# Patient Record
Sex: Male | Born: 1985
Health system: Southern US, Community
[De-identification: ages and names within clinical notes are randomized; demographics above are authoritative.]

## PROBLEM LIST (undated history)

## (undated) DIAGNOSIS — G4730 Sleep apnea, unspecified: Secondary | ICD-10-CM

## (undated) DIAGNOSIS — N200 Calculus of kidney: Secondary | ICD-10-CM

## (undated) DIAGNOSIS — R519 Headache, unspecified: Secondary | ICD-10-CM

## (undated) DIAGNOSIS — R51 Headache: Secondary | ICD-10-CM

---

## 2009-04-03 ENCOUNTER — Ambulatory Visit (HOSPITAL_COMMUNITY): Admission: RE | Admit: 2009-04-03 | Discharge: 2009-04-03 | Payer: Self-pay | Admitting: Family Medicine

## 2009-04-22 ENCOUNTER — Ambulatory Visit (HOSPITAL_COMMUNITY): Admission: RE | Admit: 2009-04-22 | Discharge: 2009-04-22 | Payer: Self-pay | Admitting: Family Medicine

## 2009-06-05 ENCOUNTER — Ambulatory Visit (HOSPITAL_COMMUNITY): Admission: RE | Admit: 2009-06-05 | Discharge: 2009-06-05 | Payer: Self-pay | Admitting: Family Medicine

## 2010-04-23 ENCOUNTER — Encounter
Admission: RE | Admit: 2010-04-23 | Discharge: 2010-04-25 | Payer: Self-pay | Source: Home / Self Care | Admitting: Specialist

## 2010-05-04 IMAGING — US US ABDOMEN COMPLETE
1 series · 14 of 25 positions shown · non-contrast
Comparison: None

CLINICAL DATA: Elevated LFTs

ULTRASOUND ABDOMEN:
TECHNIQUE: Sonography of upper abdominal structures was performed.

[Series 1: us abdomen complete · 0.32mm/px · 14 of 84 slices shown]
[im 1/84]
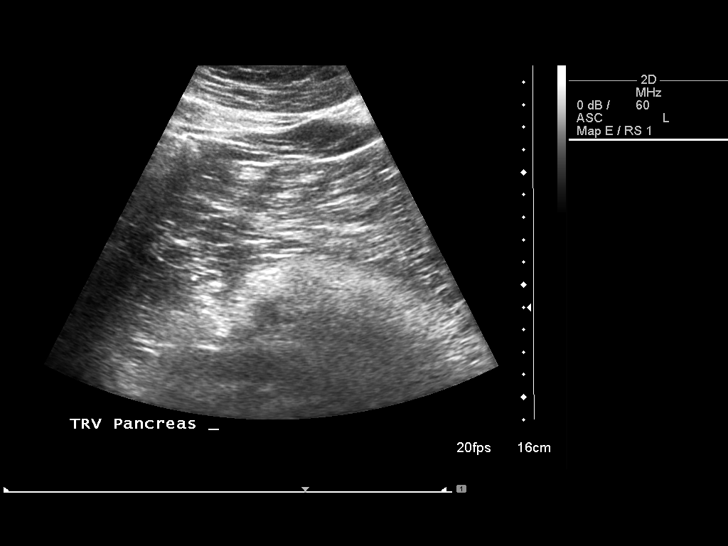
[im 7/84]
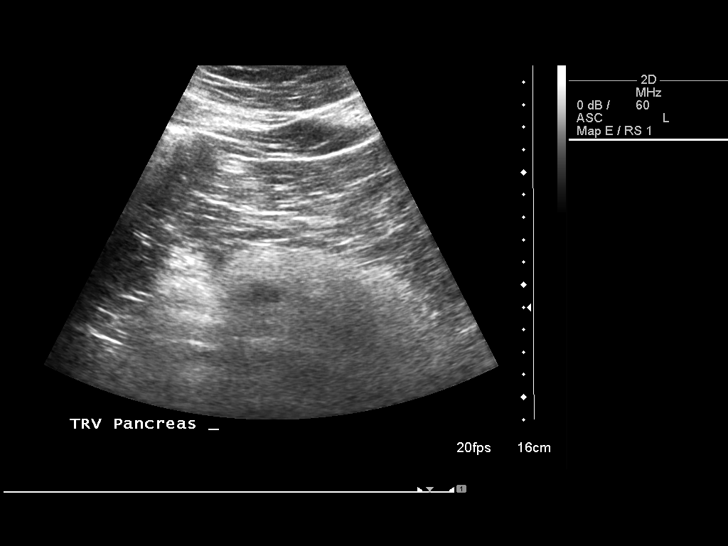
[im 14/84]
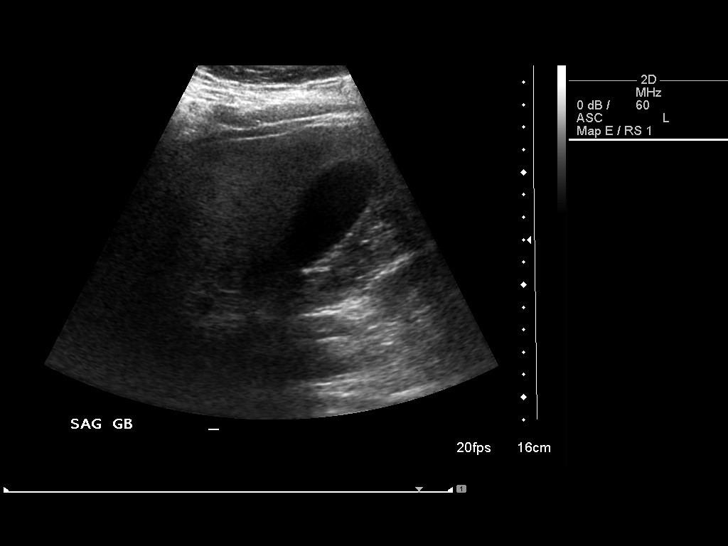
[im 21/84]
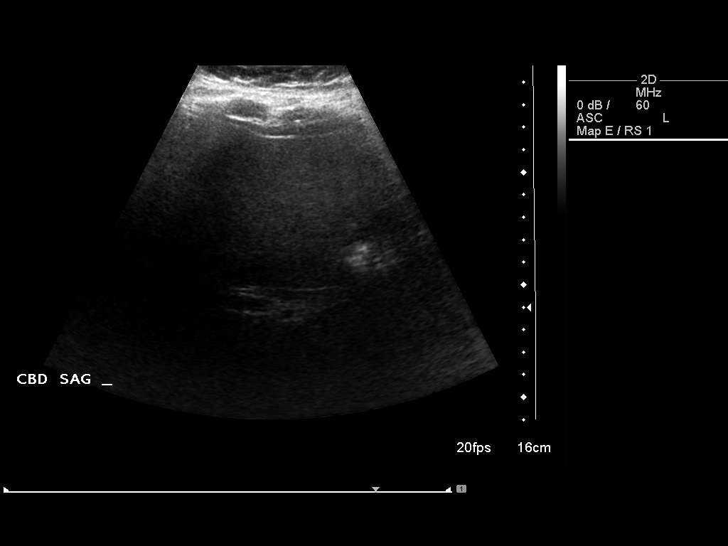
[im 28/84]
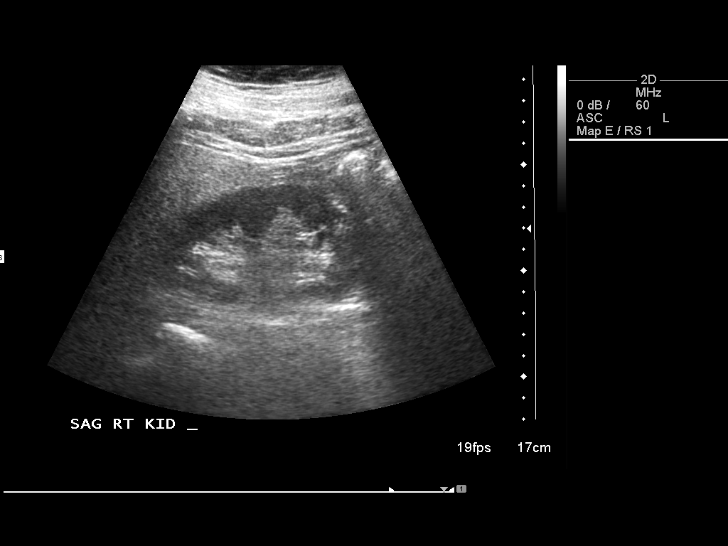
[im 32/84]
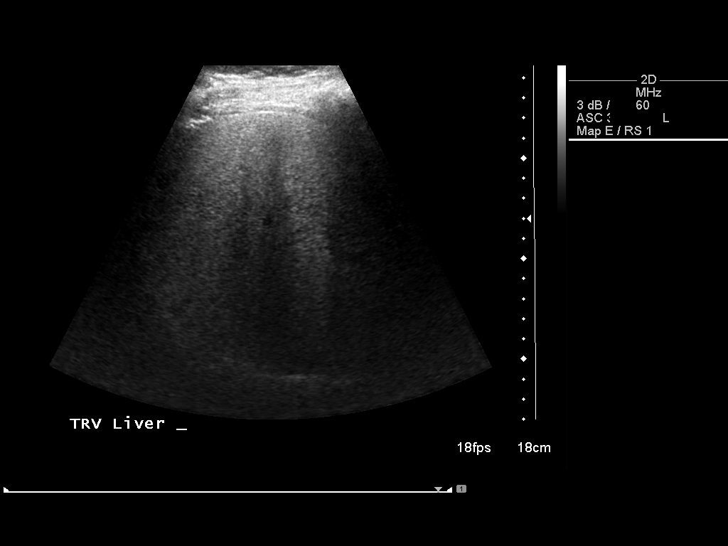
[im 39/84]
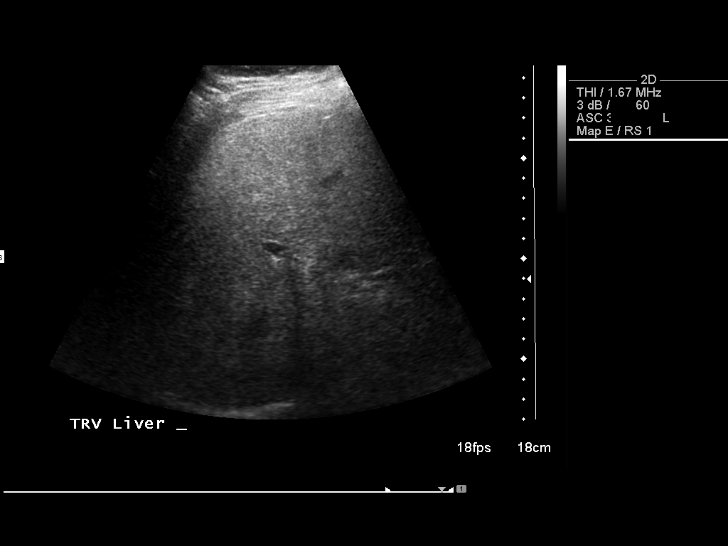
[im 45/84]
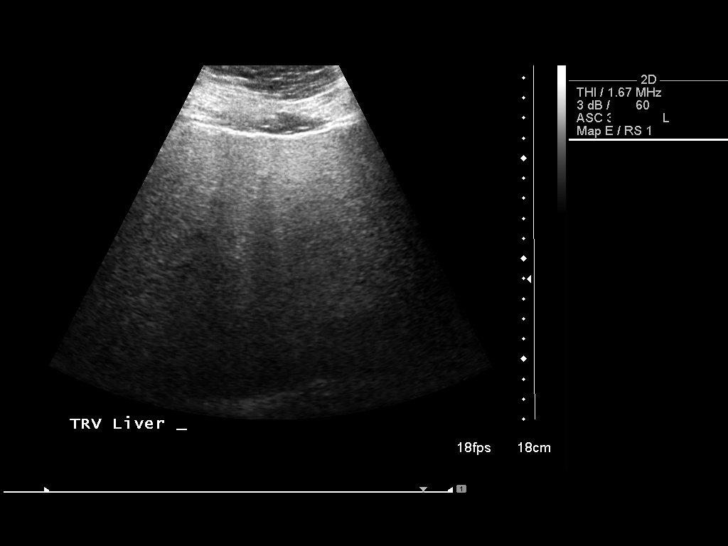
[im 52/84]
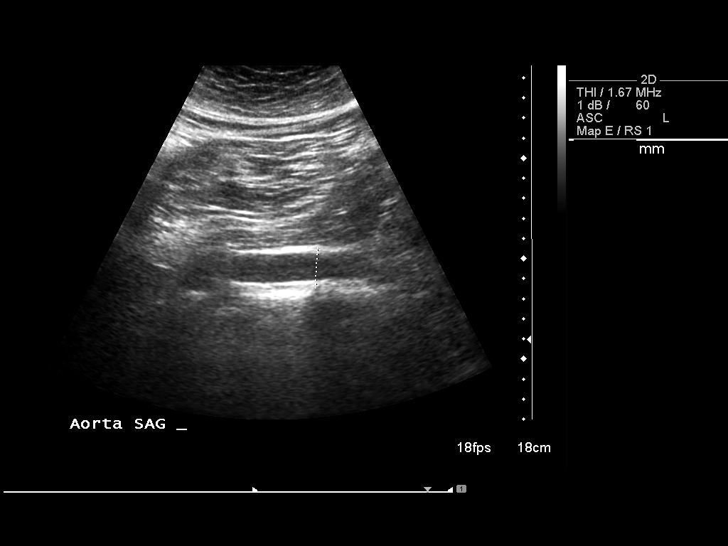
[im 56/84]
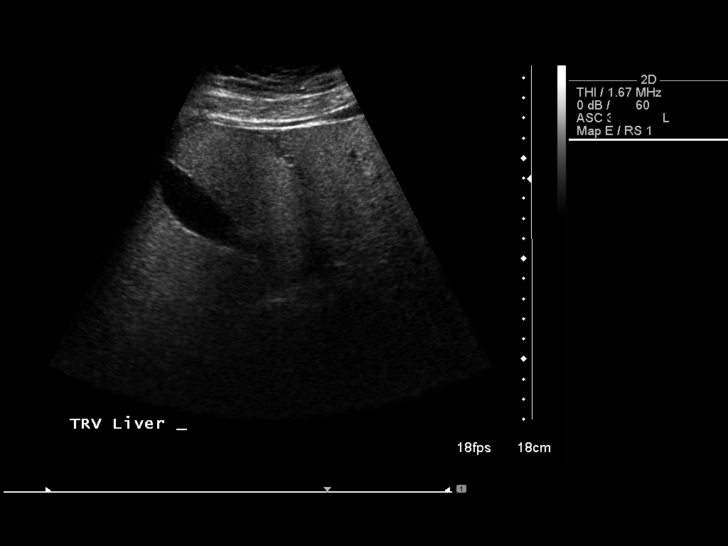
[im 63/84]
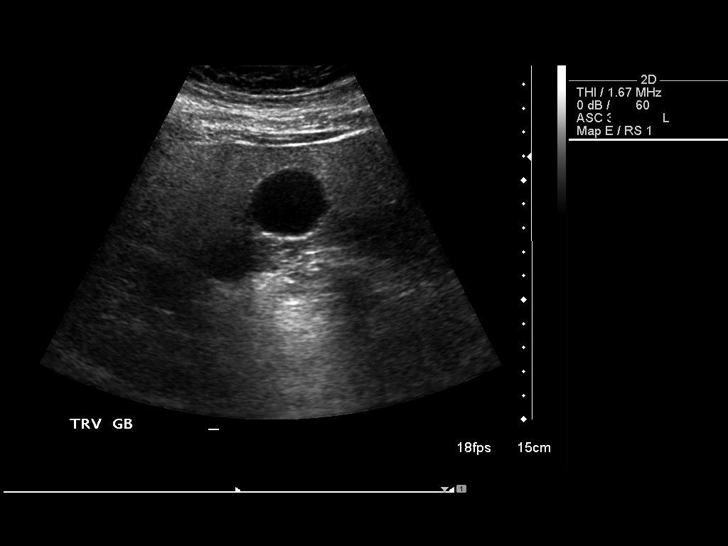
[im 70/84]
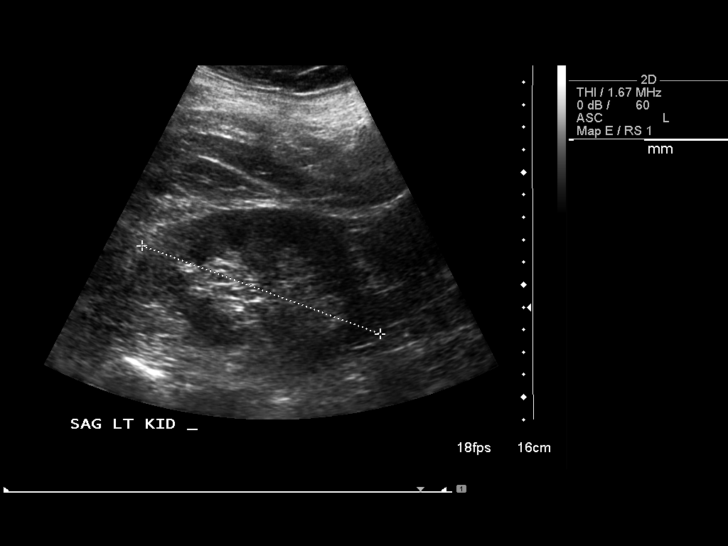
[im 77/84]
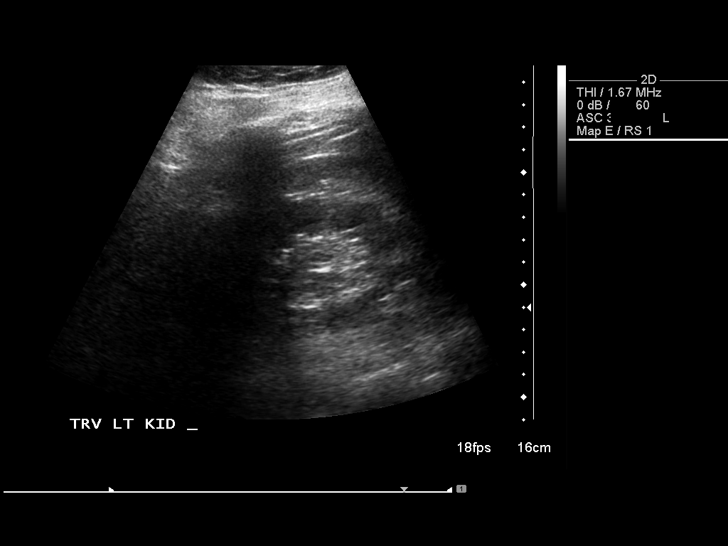
[im 84/84]
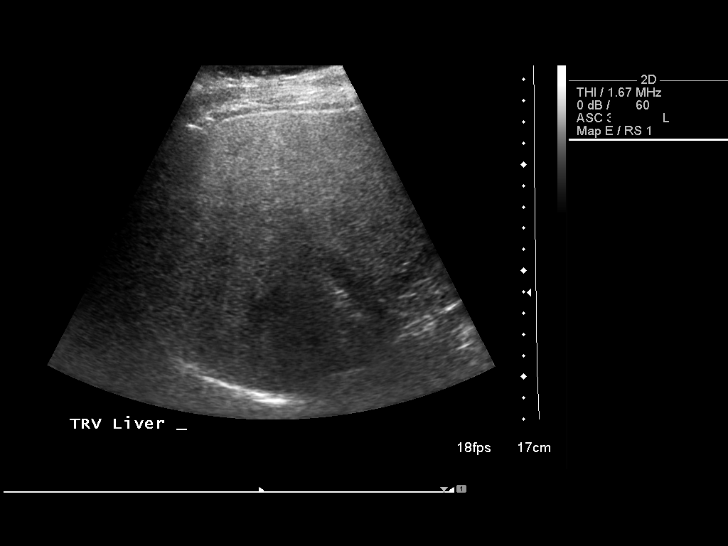

[14 of 25 positions shown; findings below may reference images not displayed]

Gallbladder:  Normally distended without stones or wall thickening.
No pericholecystic fluid or sonographic Murphy's sign.

Common bile duct:  5 mm diameter, normal

Liver:  Echogenic, likely fatty infiltration, though this can be
seen with cirrhosis and certain infiltrative disorders. No focal
hepatic mass or nodularity.

IVC:  Unremarkable

Pancreas:  Slightly heterogeneous increased pancreatic parenchymal
echogenicity suggesting partial fat replacement.  No definite
discrete mass or ductal dilatation.

Spleen:  9.4 cm length.  Normal morphology without mass.

Right kidney:  11.0 cm length. Normal morphology without mass or
hydronephrosis.

Left kidney:  11.2 cm length.  Normal morphology without mass or
hydronephrosis.

Aorta:  Unremarkable

Other:  No free fluid
IMPRESSION: Probable fatty liver and pancreas as above.
No definite acute abnormalities.

## 2010-06-23 ENCOUNTER — Ambulatory Visit (HOSPITAL_COMMUNITY): Admission: RE | Admit: 2010-06-23 | Discharge: 2010-06-23 | Payer: Self-pay | Admitting: Family Medicine

## 2010-07-09 ENCOUNTER — Emergency Department (HOSPITAL_COMMUNITY)
Admission: EM | Admit: 2010-07-09 | Discharge: 2010-07-09 | Payer: Self-pay | Source: Home / Self Care | Admitting: Emergency Medicine

## 2010-07-24 ENCOUNTER — Ambulatory Visit (HOSPITAL_COMMUNITY)
Admission: RE | Admit: 2010-07-24 | Discharge: 2010-07-24 | Payer: Self-pay | Source: Home / Self Care | Attending: Urology | Admitting: Urology

## 2010-08-26 ENCOUNTER — Other Ambulatory Visit (INDEPENDENT_AMBULATORY_CARE_PROVIDER_SITE_OTHER): Payer: Self-pay | Admitting: Internal Medicine

## 2010-08-26 ENCOUNTER — Ambulatory Visit
Admission: RE | Admit: 2010-08-26 | Discharge: 2010-08-26 | Payer: Self-pay | Source: Home / Self Care | Attending: Internal Medicine | Admitting: Internal Medicine

## 2010-08-26 DIAGNOSIS — K760 Fatty (change of) liver, not elsewhere classified: Secondary | ICD-10-CM

## 2010-08-29 ENCOUNTER — Ambulatory Visit (HOSPITAL_COMMUNITY)
Admission: RE | Admit: 2010-08-29 | Discharge: 2010-08-29 | Disposition: A | Discharge status: Home / Self Care | Payer: 59 | Source: Ambulatory Visit | Attending: Internal Medicine | Admitting: Internal Medicine

## 2010-08-29 DIAGNOSIS — K7689 Other specified diseases of liver: Secondary | ICD-10-CM | POA: Insufficient documentation

## 2010-08-29 DIAGNOSIS — K760 Fatty (change of) liver, not elsewhere classified: Secondary | ICD-10-CM

## 2010-08-29 DIAGNOSIS — K838 Other specified diseases of biliary tract: Secondary | ICD-10-CM | POA: Insufficient documentation

## 2010-10-05 NOTE — Consult Note (Signed)
NAMESKIP, LITKE           ACCOUNT NO.:  [id number]  MEDICAL RECORD NO.:  [id number]          PATIENT TYPE:  AMB  LOCATION:  [place]                           FACILITY: CLINIC  PHYSICIAN:  Dr. Karilyn Cota              DATE OF BIRTH:  March 11, 1986  DATE OF CONSULTATION:  08/26/2010                                 CONSULTATION   REASON FOR CONSULT:  Elevated transaminases and fatty liver.  HISTORY OF PRESENT ILLNESS:  Frank Gilbert is a 25 year old male referred to our office by Dr. Regino Schultze.  In November 2011, he underwent a CT of the abdomen and pelvis without contrast.  The CT revealed a fatty liver.  In December 2011, he had repeat chemistries.  It was noted that his ALT was 96 and his AST was 52.  His ALP was 81.  He states he is not having any abdominal pain at this time.  No nausea, vomiting.  No appetite changes. No constipation or diarrhea.  His appetite is good.  He has had no weight loss.  He usually have 5 bowel movements a day and they are all formed, normal size.  He denies any rectal bleeding or black stools.  He does state he has about 1-2 mixed drinks a month.  ALLERGIES:  He is allergic to PENICILLIN.  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  PAST MEDICAL HISTORY:  He has a history of migraines.  FAMILY HISTORY:  His mother is alive in good health with a history of migraines.  His father is alive with type 2 diabetes.  He has 2 sisters in good health.  He has 1 half-brother in good health.  He is married.  SOCIAL HISTORY:  He works at [employer].  He does not smoke or do drugs and he has not had any EtOH in 3 months.  He has no children.  HOME MEDICATIONS: 1. Xyzal 5 mg one a day. 2. Zonisamide 100 mg two a day. 3. Tofranil 25 mg two at night.  OBJECTIVE:  VITAL SIGNS:  His weight is 252, his height is 5 feet 9, his temp is 97.9, his blood pressure is 90/70, his pulse is 76. HEENT:  His sclerae anicteric.  His conjunctivae are pink.  He  has natural teeth.  His oral mucosa is moist.  His thyroid is normal. LUNGS:  Clear. HEART:  Regular rate and rhythm. ABDOMEN:  Soft.  Bowel sounds are positive.  No masses. EXTREMITIES:  There is no edema to his extremities.  ASSESSMENT:  Frank Gilbert is a 25 year old male with elevated transaminases.  CT scan also revealed there was diffuse fatty infiltration of the liver. while primary process may NAFLD, need to rule out other conditions.  RECOMMENDATIONS:  will get a CMET, ANA, ferritin, iron, PT/INR, sed rate, SMA, alpha-1 antitrypsin, and ceruloplasmin.  We will also draw a hepatitis screen on him.  Thank you for allowing [place] to participate in his care. Further recommendations once we have the labs back.    ______________________________ Dorene Ar, NP   ______________________________ Dr. Karilyn Cota    TS/MEDQ  D:  08/26/2010  T:  08/27/2010  Job:  [id number]  Electronically Signed by Dorene Ar PA on 08/28/2010 09:15:41 AM Electronically Signed by Lionel December M.D. on 10/05/2010 01:03:12 PM

## 2010-10-07 LAB — CBC
MCH: 31.1 pg (ref 26.0–34.0)
MCV: 82.7 fL (ref 78.0–100.0)
Platelets: 190 10*3/uL (ref 150–400)
RDW: 13 % (ref 11.5–15.5)

## 2010-10-07 LAB — BASIC METABOLIC PANEL
BUN: 15 mg/dL (ref 6–23)
Creatinine, Ser: 1.29 mg/dL (ref 0.4–1.5)
GFR calc Af Amer: >60 mL/min (ref >60)
Potassium: 4 mEq/L (ref 3.5–5.1)
Sodium: 141 mEq/L (ref 135–145)

## 2010-10-07 LAB — URINALYSIS, ROUTINE W REFLEX MICROSCOPIC
Bilirubin Urine: NEGATIVE
Glucose, UA: NEGATIVE mg/dL
Ketones, ur: NEGATIVE mg/dL
Leukocytes, UA: NEGATIVE
Protein, ur: NEGATIVE mg/dL

## 2010-10-07 LAB — DIFFERENTIAL
Eosinophils Absolute: 0 10*3/uL (ref 0.0–0.7)
Eosinophils Relative: 0 % (ref 0–5)
Lymphs Abs: 0.7 10*3/uL (ref 0.7–4.0)

## 2011-04-28 ENCOUNTER — Emergency Department (HOSPITAL_COMMUNITY): Payer: 59

## 2011-04-28 ENCOUNTER — Emergency Department (HOSPITAL_COMMUNITY)
Admission: EM | Admit: 2011-04-28 | Discharge: 2011-04-29 | Disposition: A | Discharge status: Home / Self Care | Payer: 59 | Attending: Emergency Medicine | Admitting: Emergency Medicine

## 2011-04-28 ENCOUNTER — Encounter: Payer: Self-pay | Admitting: *Deleted

## 2011-04-28 DIAGNOSIS — S6390XA Sprain of unspecified part of unspecified wrist and hand, initial encounter: Secondary | ICD-10-CM | POA: Insufficient documentation

## 2011-04-28 DIAGNOSIS — S0003XA Contusion of scalp, initial encounter: Secondary | ICD-10-CM | POA: Insufficient documentation

## 2011-04-28 DIAGNOSIS — M79609 Pain in unspecified limb: Secondary | ICD-10-CM | POA: Insufficient documentation

## 2011-04-28 DIAGNOSIS — S1093XA Contusion of unspecified part of neck, initial encounter: Secondary | ICD-10-CM | POA: Insufficient documentation

## 2011-04-28 DIAGNOSIS — S63619A Unspecified sprain of unspecified finger, initial encounter: Secondary | ICD-10-CM

## 2011-04-28 DIAGNOSIS — S0093XA Contusion of unspecified part of head, initial encounter: Secondary | ICD-10-CM

## 2011-04-28 DIAGNOSIS — R51 Headache: Secondary | ICD-10-CM | POA: Insufficient documentation

## 2011-04-28 HISTORY — DX: Calculus of kidney: N20.0

## 2011-04-28 MED ORDER — ACETAMINOPHEN 500 MG PO TABS
1000.0000 mg | ORAL_TABLET | Freq: Once | ORAL | Status: AC
  Administered 2011-04-28: 1000 mg via ORAL
  Filled 2011-04-28: qty 2
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
## 2011-04-29 NOTE — ED Notes (Signed)
Released ambulatory, no distress.

## 2012-02-24 ENCOUNTER — Other Ambulatory Visit (HOSPITAL_COMMUNITY): Payer: Self-pay | Admitting: Family Medicine

## 2012-02-24 ENCOUNTER — Ambulatory Visit (HOSPITAL_COMMUNITY)
Admission: RE | Admit: 2012-02-24 | Discharge: 2012-02-24 | Disposition: A | Discharge status: Home / Self Care | Payer: BC Managed Care – PPO | Source: Ambulatory Visit | Attending: Family Medicine | Admitting: Family Medicine

## 2012-02-24 DIAGNOSIS — A09 Infectious gastroenteritis and colitis, unspecified: Secondary | ICD-10-CM

## 2012-02-24 DIAGNOSIS — R1032 Left lower quadrant pain: Secondary | ICD-10-CM | POA: Insufficient documentation

## 2012-02-24 DIAGNOSIS — R933 Abnormal findings on diagnostic imaging of other parts of digestive tract: Secondary | ICD-10-CM | POA: Insufficient documentation

## 2014-06-26 HISTORY — PX: LITHOTRIPSY: SUR834

## 2014-07-04 ENCOUNTER — Encounter (HOSPITAL_COMMUNITY): Payer: Self-pay | Admitting: Emergency Medicine

## 2014-07-04 ENCOUNTER — Emergency Department (HOSPITAL_COMMUNITY): Payer: 59

## 2014-07-04 ENCOUNTER — Emergency Department (HOSPITAL_COMMUNITY)
Admission: EM | Admit: 2014-07-04 | Discharge: 2014-07-05 | Disposition: A | Discharge status: Home / Self Care | Payer: 59 | Attending: Emergency Medicine | Admitting: Emergency Medicine

## 2014-07-04 DIAGNOSIS — Z880 Allergy status to penicillin: Secondary | ICD-10-CM | POA: Diagnosis not present

## 2014-07-04 DIAGNOSIS — R1031 Right lower quadrant pain: Secondary | ICD-10-CM | POA: Diagnosis present

## 2014-07-04 DIAGNOSIS — J45909 Unspecified asthma, uncomplicated: Secondary | ICD-10-CM | POA: Diagnosis not present

## 2014-07-04 DIAGNOSIS — Z79899 Other long term (current) drug therapy: Secondary | ICD-10-CM | POA: Insufficient documentation

## 2014-07-04 DIAGNOSIS — R52 Pain, unspecified: Secondary | ICD-10-CM

## 2014-07-04 DIAGNOSIS — N200 Calculus of kidney: Secondary | ICD-10-CM | POA: Diagnosis not present

## 2014-07-04 LAB — CBC WITH DIFFERENTIAL/PLATELET
Basophils Absolute: 0 10*3/uL (ref 0.0–0.1)
Basophils Relative: 0 % (ref 0–1)
Eosinophils Absolute: 0.1 10*3/uL (ref 0.0–0.7)
Eosinophils Relative: 1 % (ref 0–5)
HCT: 36.3 % — ABNORMAL LOW (ref 39.0–52.0)
Hemoglobin: 12.9 g/dL — ABNORMAL LOW (ref 13.0–17.0)
LYMPHS ABS: 1.4 10*3/uL (ref 0.7–4.0)
LYMPHS PCT: 17 % (ref 12–46)
MCH: 30.3 pg (ref 26.0–34.0)
MCHC: 35.5 g/dL (ref 30.0–36.0)
MCV: 85.2 fL (ref 78.0–100.0)
Monocytes Absolute: 0.4 10*3/uL (ref 0.1–1.0)
Monocytes Relative: 6 % (ref 3–12)
NEUTROS ABS: 6 10*3/uL (ref 1.7–7.7)
NEUTROS PCT: 76 % (ref 43–77)
Platelets: 177 10*3/uL (ref 150–400)
RBC: 4.26 MIL/uL (ref 4.22–5.81)
RDW: 13.2 % (ref 11.5–15.5)
WBC: 7.9 10*3/uL (ref 4.0–10.5)

## 2014-07-04 MED ORDER — ONDANSETRON HCL 4 MG/2ML IJ SOLN
4.0000 mg | Freq: Once | INTRAMUSCULAR | Status: AC
  Administered 2014-07-04: 4 mg via INTRAVENOUS
  Filled 2014-07-04: qty 2

## 2014-07-04 MED ORDER — KETOROLAC TROMETHAMINE 30 MG/ML IJ SOLN
30.0000 mg | Freq: Once | INTRAMUSCULAR | Status: AC
  Administered 2014-07-04: 30 mg via INTRAVENOUS
  Filled 2014-07-04: qty 1

## 2014-07-04 MED ORDER — HYDROMORPHONE HCL 1 MG/ML IJ SOLN
1.0000 mg | Freq: Once | INTRAMUSCULAR | Status: AC
  Administered 2014-07-04: 1 mg via INTRAVENOUS
  Filled 2014-07-04: qty 1

## 2014-07-04 NOTE — ED Notes (Signed)
Pt c/o rt flank pain, rt abd and rt testicle pain x 2 hours.

## 2014-07-04 NOTE — ED Provider Notes (Signed)
CSN: [id number]     Arrival date & time 07/04/14  2238 History  This chart was scribed for Benny LennertJoseph L Rayn Shorb, MD by Luisa DagoPriscilla Tutu, ED Scribe. This patient was seen in room APA10/APA10 and the patient's care was started at 11:09 PM.     Chief Complaint  Patient presents with  . Flank Pain   Patient is a 28 y.o. male presenting with flank pain. The history is provided by the patient. No language interpreter was used.  Flank Pain This is a new problem. The current episode started 6 to 12 hours ago. The problem occurs constantly. The problem has been gradually worsening. Associated symptoms include abdominal pain. Pertinent negatives include no chest pain, no headaches and no shortness of breath. Nothing aggravates the symptoms. Nothing relieves the symptoms. He has tried nothing for the symptoms.   HPI Comments: Frank CoxChristopher S Gilbert is a 28 y.o. male who presents to the Emergency Department complaining of sudden onset right sided flank pain with onset today. He is also complaining of associated groin pain. Pt endorses a hx of kidney stones. He also endorses associated chills. Girlfriend states that pt also has a lump on his testicle. He endorses some associated discomfort. Denies fever, prior abdominal pain, nausea, emesis,      Past Medical History  Diagnosis Date  . Asthma   . Kidney stone    History reviewed. No pertinent past surgical history. No family history on file. History  Substance Use Topics  . Smoking status: Never Smoker   . Smokeless tobacco: Not on file  . Alcohol Use: Yes     Comment: occasionally    Review of Systems  Constitutional: Negative for appetite change and fatigue.  HENT: Negative for congestion, ear discharge and sinus pressure.   Eyes: Negative for discharge.  Respiratory: Negative for cough and shortness of breath.   Cardiovascular: Negative for chest pain.  Gastrointestinal: Positive for abdominal pain. Negative for diarrhea.  Genitourinary: Positive  for flank pain. Negative for frequency and hematuria.  Musculoskeletal: Negative for back pain.  Skin: Negative for rash.  Neurological: Negative for seizures and headaches.  Psychiatric/Behavioral: Negative for hallucinations.  All other systems reviewed and are negative.     Allergies  Oxycodone and Penicillins  Home Medications   Prior to Admission medications   Medication Sig Start Date End Date Taking? Authorizing Provider  Beclomethasone Dipropionate (QVAR IN) Inhale 1 puff into the lungs daily as needed. For shortness of breath     Historical Provider, MD  levocetirizine (XYZAL) 5 MG tablet Take 5 mg by mouth every morning.      Historical Provider, MD  methocarbamol (ROBAXIN) 750 MG tablet Take 750 mg by mouth 4 (four) times daily.      Historical Provider, MD  Tetrahydrozoline HCl (EYE DROPS OP) Apply 1 drop to eye daily.      Historical Provider, MD   Triage Vitals: BP 129/116 mmHg  Pulse 87  Temp(Src) 98 F (36.7 C) (Oral)  Resp 22  Ht 5\' 11"  (1.803 m)  Wt 260 lb (117.935 kg)  BMI 36.28 kg/m2  SpO2 98%  Physical Exam  Constitutional: He is oriented to person, place, and time. He appears well-developed and well-nourished. No distress.  HENT:  Head: Normocephalic and atraumatic.  Right Ear: Hearing normal.  Left Ear: Hearing normal.  Nose: Nose normal.  Mouth/Throat: Oropharynx is clear and moist and mucous membranes are normal.  Eyes: Conjunctivae and EOM are normal. Pupils are equal, round, and reactive  to light.  Neck: Normal range of motion. Neck supple.  Cardiovascular: Regular rhythm, S1 normal and S2 normal.  Exam reveals no gallop and no friction rub.   No murmur heard. Pulmonary/Chest: Effort normal and breath sounds normal. No respiratory distress. He exhibits no tenderness.  Abdominal: Soft. Normal appearance and bowel sounds are normal. There is no hepatosplenomegaly. There is tenderness in the right lower quadrant. There is no rebound, no guarding,  no tenderness at McBurney's point and negative Murphy's sign. No hernia.  Moderate right flank tenderness.  Musculoskeletal: Normal range of motion.  Neurological: He is alert and oriented to person, place, and time. He has normal strength. No cranial nerve deficit or sensory deficit. Coordination normal. GCS eye subscore is 4. GCS verbal subscore is 5. GCS motor subscore is 6.  Skin: Skin is warm, dry and intact. No rash noted. No cyanosis.  Psychiatric: He has a normal mood and affect. His speech is normal and behavior is normal. Thought content normal.  Nursing note and vitals reviewed.   ED Course  Procedures (including critical care time)  DIAGNOSTIC STUDIES: Oxygen Saturation is 98% on RA, normal by my interpretation.    COORDINATION OF CARE: 11:12 PM- Pt advised of plan for treatment and pt agrees.  Labs Review Labs Reviewed - No data to display  Imaging Review No results found.   EKG Interpretation None      MDM   Final diagnoses:  None    Kidney stone  I personally performed the services described in this documentation, which was scribed in my presence. The recorded information has been reviewed and is accurate.    Benny LennertJoseph L Anija Brickner, MD 07/05/14 [phone]

## 2014-07-05 LAB — COMPREHENSIVE METABOLIC PANEL
ALT: 66 U/L — AB (ref 0–53)
AST: 42 U/L — AB (ref 0–37)
Albumin: 3.9 g/dL (ref 3.5–5.2)
Alkaline Phosphatase: 80 U/L (ref 39–117)
Anion gap: 11 (ref 5–15)
BUN: 11 mg/dL (ref 6–23)
CO2: 28 meq/L (ref 19–32)
Calcium: 9.5 mg/dL (ref 8.4–10.5)
Chloride: 99 mEq/L (ref 96–112)
Creatinine, Ser: 1.15 mg/dL (ref 0.50–1.35)
GFR calc Af Amer: >90 mL/min (ref >90)
GFR, EST NON AFRICAN AMERICAN: 84 mL/min — AB (ref >90)
GLUCOSE: 108 mg/dL — AB (ref 70–99)
POTASSIUM: 3.9 meq/L (ref 3.7–5.3)
SODIUM: 138 meq/L (ref 137–147)
Total Bilirubin: 0.7 mg/dL (ref 0.3–1.2)
Total Protein: 7 g/dL (ref 6.0–8.3)

## 2014-07-05 LAB — URINALYSIS, ROUTINE W REFLEX MICROSCOPIC
BILIRUBIN URINE: NEGATIVE
Glucose, UA: NEGATIVE mg/dL
Ketones, ur: NEGATIVE mg/dL
Leukocytes, UA: NEGATIVE
Nitrite: NEGATIVE
PROTEIN: 30 mg/dL — AB
Urobilinogen, UA: 0.2 mg/dL (ref 0.0–1.0)
pH: 5.5 (ref 5.0–8.0)

## 2014-07-05 LAB — URINE MICROSCOPIC-ADD ON

## 2014-07-05 MED ORDER — TAMSULOSIN HCL 0.4 MG PO CAPS: 0.4000 mg | ORAL_CAPSULE | Freq: Every day | ORAL | Status: DC

## 2014-07-05 MED ORDER — DIPHENHYDRAMINE HCL 50 MG/ML IJ SOLN
25.0000 mg | Freq: Once | INTRAMUSCULAR | Status: AC
  Administered 2014-07-05: 25 mg via INTRAVENOUS
  Filled 2014-07-05: qty 1

## 2014-07-05 MED ORDER — HYDROCODONE-ACETAMINOPHEN 5-325 MG PO TABS: 1.0000 | ORAL_TABLET | Freq: Four times a day (QID) | ORAL | Status: DC | PRN

## 2014-07-05 MED ORDER — ONDANSETRON 4 MG PO TBDP: ORAL_TABLET | ORAL | Status: DC

## 2014-07-05 NOTE — Discharge Instructions (Signed)
Follow up with a urologist next week.  

## 2014-07-16 ENCOUNTER — Other Ambulatory Visit: Payer: Self-pay | Admitting: Urology

## 2014-07-17 ENCOUNTER — Other Ambulatory Visit: Payer: Self-pay | Admitting: Urology

## 2014-07-18 ENCOUNTER — Encounter (HOSPITAL_COMMUNITY): Payer: Self-pay | Admitting: *Deleted

## 2014-07-22 MED ORDER — GENTAMICIN SULFATE 40 MG/ML IJ SOLN
460.0000 mg | INTRAVENOUS | Status: AC
  Administered 2014-07-23: 460 mg via INTRAVENOUS
  Filled 2014-07-22: qty 11.5

## 2014-07-23 ENCOUNTER — Ambulatory Visit (HOSPITAL_COMMUNITY): Payer: 59

## 2014-07-23 ENCOUNTER — Encounter (HOSPITAL_COMMUNITY)
Admission: RE | Disposition: A | Discharge status: Home / Self Care | Payer: Self-pay | Source: Ambulatory Visit | Attending: Urology

## 2014-07-23 ENCOUNTER — Encounter (HOSPITAL_COMMUNITY): Payer: Self-pay | Admitting: General Practice

## 2014-07-23 ENCOUNTER — Ambulatory Visit (HOSPITAL_COMMUNITY)
Admission: RE | Admit: 2014-07-23 | Discharge: 2014-07-23 | Disposition: A | Discharge status: Home / Self Care | Payer: 59 | Source: Ambulatory Visit | Attending: Urology | Admitting: Urology

## 2014-07-23 DIAGNOSIS — Z885 Allergy status to narcotic agent status: Secondary | ICD-10-CM | POA: Diagnosis not present

## 2014-07-23 DIAGNOSIS — Z880 Allergy status to penicillin: Secondary | ICD-10-CM | POA: Insufficient documentation

## 2014-07-23 DIAGNOSIS — G43909 Migraine, unspecified, not intractable, without status migrainosus: Secondary | ICD-10-CM | POA: Insufficient documentation

## 2014-07-23 DIAGNOSIS — G4730 Sleep apnea, unspecified: Secondary | ICD-10-CM | POA: Diagnosis not present

## 2014-07-23 DIAGNOSIS — N201 Calculus of ureter: Secondary | ICD-10-CM | POA: Diagnosis present

## 2014-07-23 DIAGNOSIS — J45909 Unspecified asthma, uncomplicated: Secondary | ICD-10-CM | POA: Diagnosis not present

## 2014-07-23 HISTORY — DX: Headache: R51

## 2014-07-23 HISTORY — DX: Sleep apnea, unspecified: G47.30

## 2014-07-23 HISTORY — DX: Headache, unspecified: R51.9

## 2014-07-23 LAB — CREATININE, SERUM
CREATININE: 0.84 mg/dL (ref 0.50–1.35)
GFR calc Af Amer: >90 mL/min (ref >90)
GFR calc non Af Amer: >90 mL/min (ref >90)

## 2014-07-23 SURGERY — LITHOTRIPSY, ESWL
Anesthesia: LOCAL | Laterality: Right

## 2014-07-23 MED ORDER — SENNOSIDES-DOCUSATE SODIUM 8.6-50 MG PO TABS: 1.0000 | ORAL_TABLET | Freq: Two times a day (BID) | ORAL | Status: DC

## 2014-07-23 MED ORDER — DIPHENHYDRAMINE HCL 25 MG PO CAPS
25.0000 mg | ORAL_CAPSULE | ORAL | Status: AC
  Administered 2014-07-23: 25 mg via ORAL
  Filled 2014-07-23: qty 1

## 2014-07-23 MED ORDER — ONDANSETRON 4 MG PO TBDP: ORAL_TABLET | ORAL | Status: DC

## 2014-07-23 MED ORDER — SODIUM CHLORIDE 0.9 % IV SOLN
INTRAVENOUS | Status: DC
  Administered 2014-07-23: 07:00:00 via INTRAVENOUS

## 2014-07-23 MED ORDER — HYDROMORPHONE HCL 2 MG PO TABS: 2.0000 mg | ORAL_TABLET | ORAL | Status: DC | PRN

## 2014-07-23 MED ORDER — DIAZEPAM 5 MG PO TABS
10.0000 mg | ORAL_TABLET | ORAL | Status: AC
  Administered 2014-07-23: 10 mg via ORAL
  Filled 2014-07-23: qty 2

## 2014-07-23 MED ORDER — PROMETHAZINE HCL 25 MG/ML IJ SOLN
25.0000 mg | INTRAMUSCULAR | Status: DC | PRN
  Administered 2014-07-23: 25 mg via INTRAVENOUS
  Filled 2014-07-23: qty 1

## 2014-07-23 NOTE — Brief Op Note (Signed)
07/23/2014  8:34 AM  PATIENT:  Frank Gilbert  28 y.o. male  PRE-OPERATIVE DIAGNOSIS:  RIGHT URETERAL STONE  POST-OPERATIVE DIAGNOSIS:  * No post-op diagnosis entered *  PROCEDURE:  Procedure(s): RIGHT EXTRACORPOREAL SHOCK WAVE LITHOTRIPSY (ESWL) (Right)  SURGEON:  Surgeon(s) and Role:    * Sebastian Acheheodore Torri Michalski, MD - Primary  PHYSICIAN ASSISTANT:   ASSISTANTS: none   ANESTHESIA:   none  EBL:     BLOOD ADMINISTERED:none  DRAINS: none   LOCAL MEDICATIONS USED:  NONE  SPECIMEN:  No Specimen  DISPOSITION OF SPECIMEN:  N/A  COUNTS:  YES  TOURNIQUET:  * No tourniquets in log *  DICTATION: .Note written in paper chart  PLAN OF CARE: Discharge to home after PACU  PATIENT DISPOSITION:  PACU - hemodynamically stable.   Delay start of Pharmacological VTE agent (>24hrs) due to surgical blood loss or risk of bleeding: yes

## 2014-07-23 NOTE — H&P (Signed)
Frank Gilbert is an 28 y.o. male.    Chief Complaint: Pre-Op Right Shockwave Lithotripsy  HPI:   1 - Rt Ureteral Stone - Rt proximal ureteral stone 5.285mm by ER CT 06/2014 on eval colickly flank pain. Stone is 15cm SSD, 700HU and just lateral to L3 transverse process.   Given trial of medical therapy but no interval passage and persistence on KUB. NO interval fevers.  Today Frank Gilbert is seen to proceed with Rt shockwave lithotripsy.   Past Medical History  Diagnosis Date  . Asthma   . Sleep apnea     no longer uses CPAP  . Headache     migraines  . Kidney stone     right ureteral stone    History reviewed. No pertinent past surgical history.  History reviewed. No pertinent family history. Social History:  reports that he has never smoked. He does not have any smokeless tobacco history on file. He reports that he drinks alcohol. He reports that he does not use illicit drugs.  Allergies:  Allergies  Allergen Reactions  . Oxycodone Itching and Rash  . Penicillins Rash    No prescriptions prior to admission    No results found for this or any previous visit (from the past 48 hour(s)). No results found.  Review of Systems  Constitutional: Negative.  Negative for fever and chills.  HENT: Negative.   Eyes: Negative.   Respiratory: Negative.   Cardiovascular: Negative.   Gastrointestinal: Positive for nausea.  Genitourinary: Positive for flank pain.  Musculoskeletal: Negative.   Skin: Negative.   Neurological: Negative.   Endo/Heme/Allergies: Negative.   Psychiatric/Behavioral: Negative.     There were no vitals taken for this visit. Physical Exam  Constitutional: He appears well-developed.  HENT:  Head: Normocephalic.  Neck: Normal range of motion.  Cardiovascular: Normal rate.   Respiratory: Effort normal.  GI: Soft.  Genitourinary:  Mild Rt CVAT  Musculoskeletal: Normal range of motion.  Neurological: He is alert.  Skin: Skin is warm.   Psychiatric: He has a normal mood and affect. His behavior is normal. Judgment and thought content normal.     Assessment/Plan  1 - Rt Ureteral Stone - We discussed shockwave lithotripsy in detail as well as my "rule of 9s" with stones <739mm, less than 900 HU, and skin to stone distance <9cm having approximately 90% treatment success with single session of treatment. We then addressed how stones that are larger, more dense, and in patients with less favorable anatomy have incrementally decreased success rates. We discussed risks including, bleeding, infection, hematoma, loss of kidney, need for staged therapy, need for adjunctive therapy and requirement to refrain from any anticoagulants, anti-platelet or aspirin-like products peri-procedureally. After careful consideration, the patient has chosen to proceed.   Frank Gilbert 07/23/2014, 5:47 AM

## 2014-07-23 NOTE — Discharge Instructions (Signed)
1 - You may have urinary urgency (bladder spasms) and bloody urine on / off and pass small stone fragments for several days. This is normal.  2 - Call MD or go to ER for fever >102, severe pain / nausea / vomiting not relieved by medications, or acute change in medical status

## 2015-04-04 ENCOUNTER — Ambulatory Visit (INDEPENDENT_AMBULATORY_CARE_PROVIDER_SITE_OTHER): Payer: 59 | Admitting: Internal Medicine

## 2015-04-04 ENCOUNTER — Encounter (INDEPENDENT_AMBULATORY_CARE_PROVIDER_SITE_OTHER): Payer: Self-pay | Admitting: Internal Medicine

## 2015-04-04 VITALS — BP 110/80 | HR 70 | Temp 98.4°F | Resp 18 | Ht 70.0 in | Wt 275.8 lb

## 2015-04-04 DIAGNOSIS — R197 Diarrhea, unspecified: Secondary | ICD-10-CM | POA: Diagnosis not present

## 2015-04-04 DIAGNOSIS — R7989 Other specified abnormal findings of blood chemistry: Secondary | ICD-10-CM | POA: Diagnosis not present

## 2015-04-04 DIAGNOSIS — E669 Obesity, unspecified: Secondary | ICD-10-CM

## 2015-04-04 DIAGNOSIS — R945 Abnormal results of liver function studies: Secondary | ICD-10-CM

## 2015-04-04 MED ORDER — DICYCLOMINE HCL 10 MG PO CAPS: 10.0000 mg | ORAL_CAPSULE | Freq: Three times a day (TID) | ORAL | Status: DC

## 2015-04-04 NOTE — Progress Notes (Signed)
Presenting complaint;  Diarrhea and elevated transaminases.  History of present illness:  Patient is 29 year old Caucasian male who was referred through courtesy of Dr. Phillips Odor for GI evaluation. He is here for evaluation of chronic diarrhea. He was last seen in January 2012 for elevated transaminases felt to be due to fatty liver. Extent of workup reviewed under past medical history. He states he has had diarrhea for 10 years. He occasionally has normal stools. On most days he has 5-6 stools per day. He may have nocturnal bowel movement 3 days a week. He denies rectal bleeding melena nausea or vomiting. He occasionally has abdominal pain or cramping. He has never had accidents. He has taken Pepto-Bismol in the past which has a calming effect. He has not taken Imodium or other anti-diarrhea loose. He has heartburn no more than once a week and it generally occurs with certain foods. He also complains of intermittent nausea which she experiences on waking up and she blames on postnasal drip. He says he has lot of allergies. He has gained 23 pounds since he came to our office in January 2012. He does not exercise. He states he does not have time to do so. Family history is negative for celiac disease or IBD.   Current Medications: Outpatient Encounter Prescriptions as of 04/04/2015  Medication Sig  . Beclomethasone Dipropionate (QVAR IN) Inhale 1 puff into the lungs daily as needed. For shortness of breath   . benzonatate (TESSALON) 100 MG capsule daily  . diphenhydrAMINE (BENADRYL) 25 MG tablet Take 25 mg by mouth as needed for allergies.  Marland Kitchen ibuprofen (ADVIL,MOTRIN) 200 MG tablet Take 200 mg by mouth as needed.  Marland Kitchen levocetirizine (XYZAL) 5 MG tablet Take 5 mg by mouth every morning.    . ondansetron (ZOFRAN ODT) 4 MG disintegrating tablet 4mg  ODT q4 hours prn nausea/vomit  . tamsulosin (FLOMAX) 0.4 MG CAPS capsule Take 1 capsule (0.4 mg total) by mouth daily.  . Tetrahydrozoline HCl (EYE DROPS OP)  Apply 1 drop to eye daily.    . [DISCONTINUED] HYDROmorphone (DILAUDID) 2 MG tablet Take 1 tablet (2 mg total) by mouth every 4 (four) hours as needed for moderate pain or severe pain. After lithoripsy (Patient not taking: Reported on 04/04/2015)  . [DISCONTINUED] methocarbamol (ROBAXIN) 750 MG tablet Take 750 mg by mouth 4 (four) times daily.    . [DISCONTINUED] senna-docusate (SENOKOT-S) 8.6-50 MG per tablet Take 1 tablet by mouth 2 (two) times daily. While taking pain meds to prevent constipation (Patient not taking: Reported on 04/04/2015)   No facility-administered encounter medications on file as of 04/04/2015.   Past medical history:  Obesity.  Fatty liver was diagnosed in December 2011. Patient was seen in January 2012. Workup included an ultrasound, negative hepatitis B surface antigen, nonreactive HCV antibody, normal alpha-1 and ceruloplasmin levels. Sedimentation rate was 4 ANA and SMA were negative. Serum Daphine Deutscher was 102 TIBC 309 saturation 33% and serum ferritin was mildly elevated at 354(20 to-322).  History of allergies.  History of kidney stones. He underwent lithotripsy in December 2015 when he presented with right hydronephrosis. He has passed stones spontaneously twice.  He had benign tumor removed from his back when he was a teenager.   Allergies: Allergies  Allergen Reactions  . Oxycodone Itching and Rash  . Penicillins Rash    Family history: Mother has history of migraines. Health status of father is unknown. He has 1 sister and 2 half siblings his sister also has weight problems.  Social  history: He's married and has one healthy child who would be to use old in near future. 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 He drinks alcohol occasionally. He smoked cigarettes for 7 years 1-2 packs per day but quit 7 years ago.   Physical examination: Blood pressure 110/80, pulse 70, temperature  98.4 F (36.9 C), temperature source Oral, resp. rate 18, height  (1.778 m), weight 275 lb 12.8 oz (125.102 kg). Pleasant well-developed obese Caucasian male in NAD. Conjunctiva is pink. Sclera is nonicteric Oropharyngeal mucosa is normal. No neck masses or thyromegaly noted. Cardiac exam with regular rhythm normal S1 and S2. No murmur or gallop noted. Lungs are clear to auscultation. Abdomen is protuberant. Bowel sounds are normal. On palpation abdomen is soft without tenderness organomegaly or masses. No LE edema or clubbing noted. He has tattoo over her right upper extremity.  Labs/studies Results:   lab data from July 17, 2014 WBC 7.9, H&H 12.9 and 36.3 and platelet count 177K Serum sodium 138, potassium 3.9, chloride 99, CO2 28, glucose 108. BUN 11 and creatinine 1.15 Bilirubin 0.7, a PAT, AST 42, ALT 66 total protein 7.0 and albumin 3.9. Serum calcium 9.5.  Ultrasound on 08/29/2010 revealed echogenic liver and sludge within the gallbladder but no stones.  Assessment:  #1. Chronic diarrhea. He apparently has had diarrhea for 10 years. He does not have alarm symptoms. Suspect he has irritable bowel syndrome. If he does not respond to therapy will consider further workup. #2. Fatty liver. He has had elevated transaminases dating back to December 2011. Ultrasound confirmed echogenic liver. Biochemical markers for other liver diseases as above or negative. Given his young age she is at risk to develop progressive liver disease over a period of 7 years. He therefore needs to pursue with lifestyle modifications. He needs to gradually lose weight and increase physical activity. #3. Mild anemia noted in December 2015 when he presented to emergency room with urolithiasis. H&H needs to be repeated. #4. Obesity.   Recommendations:  Hemoccults 1 Dicyclomine 10 mg by mouth daily before each meal. CBC with differential. LFTs. TSH. Patient encouraged to increase intake of fiber rich  foods and he also needs to increase physical activity and try to walk 3-4 times a week. Office visit in 3 months.

## 2015-04-04 NOTE — Patient Instructions (Signed)
Increase intake of fiber rich foods. Hemoccults 1 Physician will call with results of blood tests when completed. Remember to start exercising or walking at least 3-4 times a week.

## 2015-05-01 ENCOUNTER — Encounter (INDEPENDENT_AMBULATORY_CARE_PROVIDER_SITE_OTHER): Payer: Self-pay | Admitting: *Deleted

## 2015-05-30 ENCOUNTER — Emergency Department (HOSPITAL_COMMUNITY)
Admission: EM | Admit: 2015-05-30 | Discharge: 2015-05-30 | Disposition: A | Discharge status: Home / Self Care | Payer: 59 | Attending: Emergency Medicine | Admitting: Emergency Medicine

## 2015-05-30 ENCOUNTER — Encounter (HOSPITAL_COMMUNITY): Payer: Self-pay | Admitting: Emergency Medicine

## 2015-05-30 DIAGNOSIS — G4730 Sleep apnea, unspecified: Secondary | ICD-10-CM | POA: Insufficient documentation

## 2015-05-30 DIAGNOSIS — Z87442 Personal history of urinary calculi: Secondary | ICD-10-CM | POA: Insufficient documentation

## 2015-05-30 DIAGNOSIS — Z79899 Other long term (current) drug therapy: Secondary | ICD-10-CM | POA: Insufficient documentation

## 2015-05-30 DIAGNOSIS — J45909 Unspecified asthma, uncomplicated: Secondary | ICD-10-CM | POA: Diagnosis not present

## 2015-05-30 DIAGNOSIS — K529 Noninfective gastroenteritis and colitis, unspecified: Secondary | ICD-10-CM | POA: Diagnosis not present

## 2015-05-30 DIAGNOSIS — Z9981 Dependence on supplemental oxygen: Secondary | ICD-10-CM | POA: Insufficient documentation

## 2015-05-30 DIAGNOSIS — Z87891 Personal history of nicotine dependence: Secondary | ICD-10-CM | POA: Insufficient documentation

## 2015-05-30 DIAGNOSIS — R319 Hematuria, unspecified: Secondary | ICD-10-CM | POA: Insufficient documentation

## 2015-05-30 DIAGNOSIS — R112 Nausea with vomiting, unspecified: Secondary | ICD-10-CM | POA: Diagnosis present

## 2015-05-30 DIAGNOSIS — Z880 Allergy status to penicillin: Secondary | ICD-10-CM | POA: Diagnosis not present

## 2015-05-30 LAB — CBC WITH DIFFERENTIAL/PLATELET
BASOS ABS: 0 10*3/uL (ref 0.0–0.1)
Basophils Relative: 0 %
Eosinophils Absolute: 0 10*3/uL (ref 0.0–0.7)
Eosinophils Relative: 0 %
HEMATOCRIT: 42.3 % (ref 39.0–52.0)
HEMOGLOBIN: 14.8 g/dL (ref 13.0–17.0)
LYMPHS PCT: 10 %
Lymphs Abs: 0.9 10*3/uL (ref 0.7–4.0)
MCH: 30.5 pg (ref 26.0–34.0)
MCHC: 35 g/dL (ref 30.0–36.0)
MCV: 87 fL (ref 78.0–100.0)
Monocytes Absolute: 0.4 10*3/uL (ref 0.1–1.0)
Monocytes Relative: 5 %
NEUTROS ABS: 7.4 10*3/uL (ref 1.7–7.7)
Neutrophils Relative %: 85 %
Platelets: 171 10*3/uL (ref 150–400)
RBC: 4.86 MIL/uL (ref 4.22–5.81)
RDW: 13.9 % (ref 11.5–15.5)
WBC: 8.7 10*3/uL (ref 4.0–10.5)

## 2015-05-30 LAB — URINALYSIS, ROUTINE W REFLEX MICROSCOPIC
Glucose, UA: NEGATIVE mg/dL
Ketones, ur: NEGATIVE mg/dL
LEUKOCYTES UA: NEGATIVE
NITRITE: NEGATIVE
PROTEIN: 30 mg/dL — AB
Specific Gravity, Urine: >1.03 — ABNORMAL HIGH (ref 1.005–1.030)
UROBILINOGEN UA: 0.2 mg/dL (ref 0.0–1.0)
pH: 5.5 (ref 5.0–8.0)

## 2015-05-30 LAB — COMPREHENSIVE METABOLIC PANEL
ALT: 75 U/L — AB (ref 17–63)
ANION GAP: 8 (ref 5–15)
AST: 44 U/L — ABNORMAL HIGH (ref 15–41)
Albumin: 4.5 g/dL (ref 3.5–5.0)
Alkaline Phosphatase: 91 U/L (ref 38–126)
BUN: 12 mg/dL (ref 6–20)
CHLORIDE: 106 mmol/L (ref 101–111)
CO2: 23 mmol/L (ref 22–32)
CREATININE: 0.93 mg/dL (ref 0.61–1.24)
Calcium: 9.3 mg/dL (ref 8.9–10.3)
GFR calc non Af Amer: >60 mL/min (ref >60)
Glucose, Bld: 135 mg/dL — ABNORMAL HIGH (ref 65–99)
POTASSIUM: 3.8 mmol/L (ref 3.5–5.1)
SODIUM: 137 mmol/L (ref 135–145)
Total Bilirubin: 1.8 mg/dL — ABNORMAL HIGH (ref 0.3–1.2)
Total Protein: 8.1 g/dL (ref 6.5–8.1)

## 2015-05-30 LAB — URINE MICROSCOPIC-ADD ON

## 2015-05-30 LAB — LIPASE, BLOOD: Lipase: 23 U/L (ref 11–51)

## 2015-05-30 MED ORDER — DIPHENOXYLATE-ATROPINE 2.5-0.025 MG PO TABS: 2.0000 | ORAL_TABLET | Freq: Four times a day (QID) | ORAL | Status: DC | PRN

## 2015-05-30 MED ORDER — ONDANSETRON HCL 4 MG/2ML IJ SOLN
4.0000 mg | Freq: Once | INTRAMUSCULAR | Status: AC
  Administered 2015-05-30: 4 mg via INTRAVENOUS
  Filled 2015-05-30: qty 2

## 2015-05-30 MED ORDER — DIPHENOXYLATE-ATROPINE 2.5-0.025 MG PO TABS
2.0000 | ORAL_TABLET | Freq: Once | ORAL | Status: AC
  Administered 2015-05-30: 2 via ORAL
  Filled 2015-05-30: qty 2

## 2015-05-30 MED ORDER — SODIUM CHLORIDE 0.9 % IV BOLUS (SEPSIS)
1000.0000 mL | Freq: Once | INTRAVENOUS | Status: AC
  Administered 2015-05-30: 1000 mL via INTRAVENOUS

## 2015-05-30 MED ORDER — HYDROCODONE-ACETAMINOPHEN 5-325 MG PO TABS: 1.0000 | ORAL_TABLET | ORAL | Status: DC | PRN

## 2015-05-30 MED ORDER — ONDANSETRON 4 MG PO TBDP: ORAL_TABLET | ORAL | Status: DC

## 2015-05-30 NOTE — ED Notes (Signed)
Pt states that he caught the "stomach bug" from his daughter 2 days ago.  Has been having diarrhea and vomiting since.  Tonight started having right sided abd pain

## 2015-05-30 NOTE — ED Provider Notes (Addendum)
CSN: [id number]     Arrival date & time 05/30/15  0129 History   First MD Initiated Contact with Patient 05/30/15 0136     Chief Complaint  Patient presents with  . Abdominal Pain     (Consider location/radiation/quality/duration/timing/severity/associated sxs/prior Treatment) HPI Comments: Presents to the emergency department for evaluation of nausea, vomiting, diarrhea with abdominal pain. Patient reports symptoms began 2 days ago. His daughter had gastroenteritis several days before onset of his symptoms. Patient reports persistent vomiting and diarrhea through the night tonight. He has been having intermittent episodes of stabbing, cramping pain in his abdomen. This is relieved by vomiting and/or diarrhea.  Patient is a 29 y.o. male presenting with abdominal pain.  Abdominal Pain Associated symptoms: diarrhea, nausea and vomiting     Past Medical History  Diagnosis Date  . Asthma   . Sleep apnea     no longer uses CPAP  . Headache     migraines  . Kidney stone     right ureteral stone   Past Surgical History  Procedure Laterality Date  . Lithotripsy  06/2014   Family History  Problem Relation Age of Onset  . Migraines Mother   . Healthy Father   . Healthy Sister   . Healthy Sister   . Healthy Child    Social History  Substance Use Topics  . Smoking status: Former Smoker    Quit date: 04/03/2008  . Smokeless tobacco: Former NeurosurgeonUser    Quit date: 04/03/2005  . Alcohol Use: 0.0 oz/week    0 Standard drinks or equivalent per week     Comment: Rare    Review of Systems  Gastrointestinal: Positive for nausea, vomiting, abdominal pain and diarrhea.  All other systems reviewed and are negative.     Allergies  Oxycodone and Penicillins  Home Medications   Prior to Admission medications   Medication Sig Start Date End Date Taking? Authorizing Provider  Beclomethasone Dipropionate (QVAR IN) Inhale 1 puff into the lungs daily as needed. For shortness of breath      Historical Provider, MD  benzonatate (TESSALON) 100 MG capsule daily 03/06/15   Historical Provider, MD  dicyclomine (BENTYL) 10 MG capsule Take 1 capsule (10 mg total) by mouth 3 (three) times daily before meals. 04/04/15   Malissa HippoNajeeb U Rehman, MD  diphenhydrAMINE (BENADRYL) 25 MG tablet Take 25 mg by mouth as needed for allergies.    Historical Provider, MD  ibuprofen (ADVIL,MOTRIN) 200 MG tablet Take 200 mg by mouth as needed.    Historical Provider, MD  levocetirizine (XYZAL) 5 MG tablet Take 5 mg by mouth every morning.      Historical Provider, MD  ondansetron (ZOFRAN ODT) 4 MG disintegrating tablet 4mg  ODT q4 hours prn nausea/vomit 07/23/14   Sebastian Acheheodore Manny, MD  tamsulosin (FLOMAX) 0.4 MG CAPS capsule Take 1 capsule (0.4 mg total) by mouth daily. 07/05/14   Bethann BerkshireJoseph Zammit, MD  Tetrahydrozoline HCl (EYE DROPS OP) Apply 1 drop to eye daily.      Historical Provider, MD   BP 134/89 mmHg  Pulse 78  Temp(Src) 98.1 F (36.7 C) (Oral)  Resp 16  Ht 5\' 9"  (1.753 m)  Wt 275 lb (124.739 kg)  BMI 40.59 kg/m2  SpO2 97% Physical Exam  Constitutional: He is oriented to person, place, and time. He appears well-developed and well-nourished. No distress.  HENT:  Head: Normocephalic and atraumatic.  Right Ear: Hearing normal.  Left Ear: Hearing normal.  Nose: Nose normal.  Mouth/Throat: Oropharynx  is clear and moist and mucous membranes are normal.  Eyes: Conjunctivae and EOM are normal. Pupils are equal, round, and reactive to light.  Neck: Normal range of motion. Neck supple.  Cardiovascular: Regular rhythm, S1 normal and S2 normal.  Exam reveals no gallop and no friction rub.   No murmur heard. Pulmonary/Chest: Effort normal and breath sounds normal. No respiratory distress. He exhibits no tenderness.  Abdominal: Soft. Normal appearance and bowel sounds are normal. There is no hepatosplenomegaly. There is no tenderness. There is no rebound, no guarding, no tenderness at McBurney's point and negative  Murphy's sign. No hernia.  Musculoskeletal: Normal range of motion.  Neurological: He is alert and oriented to person, place, and time. He has normal strength. No cranial nerve deficit or sensory deficit. Coordination normal. GCS eye subscore is 4. GCS verbal subscore is 5. GCS motor subscore is 6.  Skin: Skin is warm, dry and intact. No rash noted. No cyanosis.  Psychiatric: He has a normal mood and affect. His speech is normal and behavior is normal. Thought content normal.  Nursing note and vitals reviewed.   ED Course  Procedures (including critical care time) Labs Review Labs Reviewed  COMPREHENSIVE METABOLIC PANEL - Abnormal; Notable for the following:    Glucose, Bld 135 (*)    AST 44 (*)    ALT 75 (*)    Total Bilirubin 1.8 (*)    All other components within normal limits  URINALYSIS, ROUTINE W REFLEX MICROSCOPIC (NOT AT [facility]) - Abnormal; Notable for the following:    Color, Urine AMBER (*)    APPearance HAZY (*)    Specific Gravity, Urine >1.030 (*)    Hgb urine dipstick LARGE (*)    Bilirubin Urine SMALL (*)    Protein, ur 30 (*)    All other components within normal limits  URINE MICROSCOPIC-ADD ON - Abnormal; Notable for the following:    Bacteria, UA MANY (*)    All other components within normal limits  CBC WITH DIFFERENTIAL/PLATELET  LIPASE, BLOOD    Imaging Review No results found. I have personally reviewed and evaluated these images and lab results as part of my medical decision-making.   EKG Interpretation None      MDM   Final diagnoses:  None   gastroenteritis Hematuria, possible renal colic  Presents to the ER for evaluation of nausea, vomiting and diarrhea. Patient does have a sick contact at home, states that his daughter has similar symptoms. Patient has been experiencing intermittent sharp crampy pain associated with copious diarrhea and inability to hold down solids and liquids secondary to vomiting. Patient did appear clinically mildly  dehydrated. Vital signs are stable. Abdominal exam is benign. Lab work was normal. Patient hydrated and treated symptomatically with improvement.  Addendum: Patient's urinalysis did return and he does have microscopic immature area. Reviewing his records reveals that he does have a history of kidney stones. Patient had been having some right flank pain earlier, this has resolved now. I do not feel he requires any further imaging or workup at this time, but would refer back to his urologist for further evaluation and management. Will add analgesia to his prescriptions.   Gilda Crease, MD 05/30/15 0234  Gilda Crease, MD 05/30/15 [phone]

## 2015-05-30 NOTE — Discharge Instructions (Signed)
Hematuria, Adult Hematuria is blood in your urine. It can be caused by a bladder infection, kidney infection, prostate infection, kidney stone, or cancer of your urinary tract. Infections can usually be treated with medicine, and a kidney stone usually will pass through your urine. If neither of these is the cause of your hematuria, further workup to find out the reason may be needed. It is very important that you tell your health care provider about any blood you see in your urine, even if the blood stops without treatment or happens without causing pain. Blood in your urine that happens and then stops and then happens again can be a symptom of a very serious condition. Also, pain is not a symptom in the initial stages of many urinary cancers. HOME CARE INSTRUCTIONS   Drink lots of fluid, 3-4 quarts a day. If you have been diagnosed with an infection, cranberry juice is especially recommended, in addition to large amounts of water.  Avoid caffeine, tea, and carbonated beverages because they tend to irritate the bladder.  Avoid alcohol because it may irritate the prostate.  Take all medicines as directed by your health care provider.  If you were prescribed an antibiotic medicine, finish it all even if you start to feel better.  If you have been diagnosed with a kidney stone, follow your health care provider's instructions regarding straining your urine to catch the stone.  Empty your bladder often. Avoid holding urine for long periods of time.  After a bowel movement, women should cleanse front to back. Use each tissue only once.  Empty your bladder before and after sexual intercourse if you are a male. SEEK MEDICAL CARE IF:  You develop back pain.  You have a fever.  You have a feeling of sickness in your stomach (nausea) or vomiting.  Your symptoms are not better in 3 days. Return sooner if you are getting worse. SEEK IMMEDIATE MEDICAL CARE IF:   You develop severe vomiting  and are unable to keep the medicine down.  You develop severe back or abdominal pain despite taking your medicines.  You begin passing a large amount of blood or clots in your urine.  You feel extremely weak or faint, or you pass out. MAKE SURE YOU:   Understand these instructions.  Will watch your condition.  Will get help right away if you are not doing well or get worse.   This information is not intended to replace advice given to you by your health care provider. Make sure you discuss any questions you have with your health care provider.   Document Released: 07/13/2005 Document Revised: 08/03/2014 Document Reviewed: 03/13/2013 Elsevier Interactive Patient Education 2016 [employer] Viral Gastroenteritis Viral gastroenteritis is also known as stomach flu. This condition affects the stomach and intestinal tract. It can cause sudden diarrhea and vomiting. The illness typically lasts 3 to 8 days. Most people develop an immune response that eventually gets rid of the virus. While this natural response develops, the virus can make you quite ill. CAUSES  Many different viruses can cause gastroenteritis, such as rotavirus or noroviruses. You can catch one of these viruses by consuming contaminated food or water. You may also catch a virus by sharing utensils or other personal items with an infected person or by touching a contaminated surface. SYMPTOMS  The most common symptoms are diarrhea and vomiting. These problems can cause a severe loss of body fluids (dehydration) and a body salt (electrolyte) imbalance. Other symptoms may include:  Fever.  Headache.  Fatigue.  Abdominal pain. DIAGNOSIS  Your caregiver can usually diagnose viral gastroenteritis based on your symptoms and a physical exam. A stool sample may also be taken to test for the presence of viruses or other infections. TREATMENT  This illness typically goes away on its own. Treatments are aimed at rehydration. The  most serious cases of viral gastroenteritis involve vomiting so severely that you are not able to keep fluids down. In these cases, fluids must be given through an intravenous line (IV). HOME CARE INSTRUCTIONS   Drink enough fluids to keep your urine clear or pale yellow. Drink small amounts of fluids frequently and increase the amounts as tolerated.  Ask your caregiver for specific rehydration instructions.  Avoid:  Foods high in sugar.  Alcohol.  Carbonated drinks.  Tobacco.  Juice.  Caffeine drinks.  Extremely hot or cold fluids.  Fatty, greasy foods.  Too much intake of anything at one time.  Dairy products until 24 to 48 hours after diarrhea stops.  You may consume probiotics. Probiotics are active cultures of beneficial bacteria. They may lessen the amount and number of diarrheal stools in adults. Probiotics can be found in yogurt with active cultures and in supplements.  Wash your hands well to avoid spreading the virus.  Only take over-the-counter or prescription medicines for pain, discomfort, or fever as directed by your caregiver. Do not give aspirin to children. Antidiarrheal medicines are not recommended.  Ask your caregiver if you should continue to take your regular prescribed and over-the-counter medicines.  Keep all follow-up appointments as directed by your caregiver. SEEK IMMEDIATE MEDICAL CARE IF:   You are unable to keep fluids down.  You do not urinate at least once every 6 to 8 hours.  You develop shortness of breath.  You notice blood in your stool or vomit. This may look like coffee grounds.  You have abdominal pain that increases or is concentrated in one small area (localized).  You have persistent vomiting or diarrhea.  You have a fever.  The patient is a child younger than 3 months, and he or she has a fever.  The patient is a child older than 3 months, and he or she has a fever and persistent symptoms.  The patient is a child  older than 3 months, and he or she has a fever and symptoms suddenly get worse.  The patient is a baby, and he or she has no tears when crying. MAKE SURE YOU:   Understand these instructions.  Will watch your condition.  Will get help right away if you are not doing well or get worse.   This information is not intended to replace advice given to you by your health care provider. Make sure you discuss any questions you have with your health care provider.   Document Released: 07/13/2005 Document Revised: 10/05/2011 Document Reviewed: 04/29/2011 Elsevier Interactive Patient Education [employer]

## 2015-05-30 NOTE — ED Notes (Signed)
Pt states understanding of care given and follow up instructions 

## 2015-07-09 ENCOUNTER — Ambulatory Visit (INDEPENDENT_AMBULATORY_CARE_PROVIDER_SITE_OTHER): Payer: 59 | Admitting: Internal Medicine

## 2015-07-09 ENCOUNTER — Encounter (INDEPENDENT_AMBULATORY_CARE_PROVIDER_SITE_OTHER): Payer: Self-pay | Admitting: *Deleted

## 2015-08-04 IMAGING — CR DG ABDOMEN 1V
1 series · 1 of 1 positions shown · non-contrast
Comparison: 07/16/2014

CLINICAL DATA: Preop for ESWL.  Right ureteral stone.

EXAM:
ABDOMEN - 1 VIEW

[t abdomen supine]
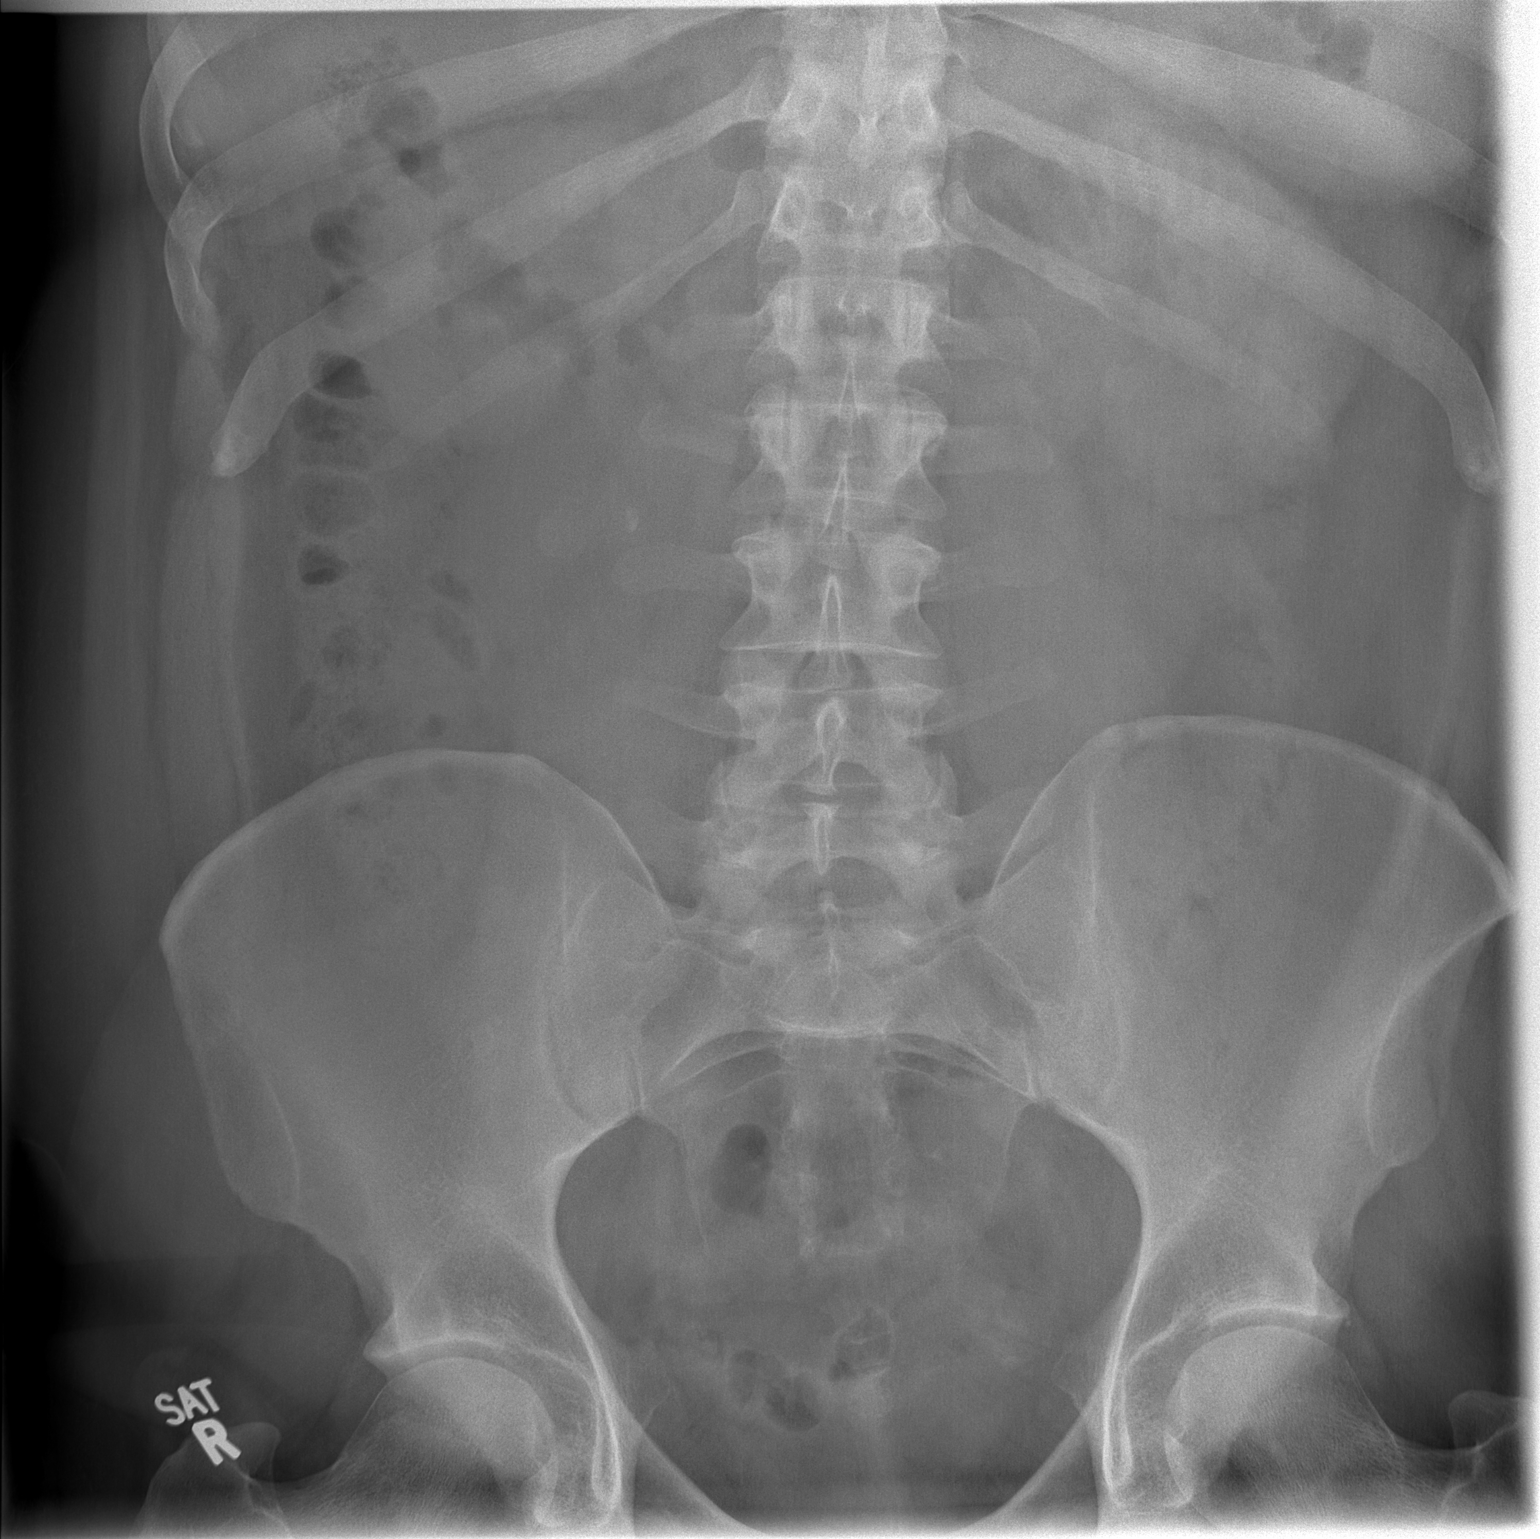

[1 of 1 positions shown; findings below may reference images not displayed]

FINDINGS: There is a 7 mm calculus overlapping the upper right ureter,
unchanged from comparison radiograph and confirmed on abdominal CT
07/05/2014. No additional urolithiasis identified radiographically.
No unexpected intra-abdominal findings including organomegaly or
bowel obstruction.
IMPRESSION: Persistent 7 mm upper right ureteral calculus.

## 2016-03-26 DIAGNOSIS — Z1389 Encounter for screening for other disorder: Secondary | ICD-10-CM | POA: Diagnosis not present

## 2016-03-26 DIAGNOSIS — Z6838 Body mass index (BMI) 38.0-38.9, adult: Secondary | ICD-10-CM | POA: Diagnosis not present

## 2016-03-26 DIAGNOSIS — K760 Fatty (change of) liver, not elsewhere classified: Secondary | ICD-10-CM | POA: Diagnosis not present

## 2017-06-04 DIAGNOSIS — Z1389 Encounter for screening for other disorder: Secondary | ICD-10-CM | POA: Diagnosis not present

## 2017-06-04 DIAGNOSIS — Z23 Encounter for immunization: Secondary | ICD-10-CM | POA: Diagnosis not present

## 2017-06-04 DIAGNOSIS — R5383 Other fatigue: Secondary | ICD-10-CM | POA: Diagnosis not present

## 2017-06-04 DIAGNOSIS — Z6841 Body Mass Index (BMI) 40.0 and over, adult: Secondary | ICD-10-CM | POA: Diagnosis not present

## 2017-06-04 DIAGNOSIS — G4733 Obstructive sleep apnea (adult) (pediatric): Secondary | ICD-10-CM | POA: Diagnosis not present

## 2017-07-06 DIAGNOSIS — H5203 Hypermetropia, bilateral: Secondary | ICD-10-CM | POA: Diagnosis not present

## 2017-07-06 DIAGNOSIS — H52223 Regular astigmatism, bilateral: Secondary | ICD-10-CM | POA: Diagnosis not present

## 2017-07-26 DIAGNOSIS — G4733 Obstructive sleep apnea (adult) (pediatric): Secondary | ICD-10-CM | POA: Diagnosis not present

## 2017-08-16 DIAGNOSIS — R05 Cough: Secondary | ICD-10-CM | POA: Diagnosis not present

## 2017-08-16 DIAGNOSIS — B349 Viral infection, unspecified: Secondary | ICD-10-CM | POA: Diagnosis not present

## 2017-08-16 DIAGNOSIS — R509 Fever, unspecified: Secondary | ICD-10-CM | POA: Diagnosis not present

## 2017-08-16 DIAGNOSIS — J029 Acute pharyngitis, unspecified: Secondary | ICD-10-CM | POA: Diagnosis not present

## 2017-08-16 DIAGNOSIS — Z1389 Encounter for screening for other disorder: Secondary | ICD-10-CM | POA: Diagnosis not present

## 2017-08-16 DIAGNOSIS — J0100 Acute maxillary sinusitis, unspecified: Secondary | ICD-10-CM | POA: Diagnosis not present

## 2017-08-16 DIAGNOSIS — Z6841 Body Mass Index (BMI) 40.0 and over, adult: Secondary | ICD-10-CM | POA: Diagnosis not present

## 2017-08-16 DIAGNOSIS — H938X3 Other specified disorders of ear, bilateral: Secondary | ICD-10-CM | POA: Diagnosis not present

## 2017-08-26 DIAGNOSIS — G4733 Obstructive sleep apnea (adult) (pediatric): Secondary | ICD-10-CM | POA: Diagnosis not present

## 2017-09-23 DIAGNOSIS — G4733 Obstructive sleep apnea (adult) (pediatric): Secondary | ICD-10-CM | POA: Diagnosis not present

## 2017-10-24 DIAGNOSIS — G4733 Obstructive sleep apnea (adult) (pediatric): Secondary | ICD-10-CM | POA: Diagnosis not present

## 2017-11-22 DIAGNOSIS — J302 Other seasonal allergic rhinitis: Secondary | ICD-10-CM | POA: Diagnosis not present

## 2017-11-22 DIAGNOSIS — R05 Cough: Secondary | ICD-10-CM | POA: Diagnosis not present

## 2017-11-22 DIAGNOSIS — Z6841 Body Mass Index (BMI) 40.0 and over, adult: Secondary | ICD-10-CM | POA: Diagnosis not present

## 2017-11-22 DIAGNOSIS — R0981 Nasal congestion: Secondary | ICD-10-CM | POA: Diagnosis not present

## 2017-12-23 DIAGNOSIS — R05 Cough: Secondary | ICD-10-CM | POA: Diagnosis not present

## 2017-12-23 DIAGNOSIS — Z6841 Body Mass Index (BMI) 40.0 and over, adult: Secondary | ICD-10-CM | POA: Diagnosis not present

## 2017-12-23 DIAGNOSIS — K219 Gastro-esophageal reflux disease without esophagitis: Secondary | ICD-10-CM | POA: Diagnosis not present

## 2017-12-23 DIAGNOSIS — Z1389 Encounter for screening for other disorder: Secondary | ICD-10-CM | POA: Diagnosis not present

## 2017-12-23 DIAGNOSIS — Z0001 Encounter for general adult medical examination with abnormal findings: Secondary | ICD-10-CM | POA: Diagnosis not present

## 2017-12-23 DIAGNOSIS — G4733 Obstructive sleep apnea (adult) (pediatric): Secondary | ICD-10-CM | POA: Diagnosis not present

## 2018-01-13 ENCOUNTER — Other Ambulatory Visit (HOSPITAL_COMMUNITY): Payer: Self-pay | Admitting: Family Medicine

## 2018-01-13 ENCOUNTER — Ambulatory Visit (HOSPITAL_COMMUNITY)
Admission: RE | Admit: 2018-01-13 | Discharge: 2018-01-13 | Disposition: A | Discharge status: Home / Self Care | Payer: 59 | Source: Ambulatory Visit | Attending: Family Medicine | Admitting: Family Medicine

## 2018-01-13 DIAGNOSIS — R059 Cough, unspecified: Secondary | ICD-10-CM

## 2018-01-13 DIAGNOSIS — K219 Gastro-esophageal reflux disease without esophagitis: Secondary | ICD-10-CM | POA: Diagnosis not present

## 2018-01-13 DIAGNOSIS — Z0000 Encounter for general adult medical examination without abnormal findings: Secondary | ICD-10-CM | POA: Diagnosis not present

## 2018-01-13 DIAGNOSIS — R05 Cough: Secondary | ICD-10-CM

## 2018-01-13 DIAGNOSIS — Z6841 Body Mass Index (BMI) 40.0 and over, adult: Secondary | ICD-10-CM | POA: Insufficient documentation

## 2018-01-13 DIAGNOSIS — G4733 Obstructive sleep apnea (adult) (pediatric): Secondary | ICD-10-CM | POA: Diagnosis not present

## 2018-01-13 DIAGNOSIS — Z1389 Encounter for screening for other disorder: Secondary | ICD-10-CM | POA: Diagnosis not present

## 2018-01-13 DIAGNOSIS — Z0001 Encounter for general adult medical examination with abnormal findings: Secondary | ICD-10-CM | POA: Diagnosis not present

## 2018-02-03 DIAGNOSIS — L309 Dermatitis, unspecified: Secondary | ICD-10-CM | POA: Diagnosis not present

## 2018-04-21 DIAGNOSIS — J301 Allergic rhinitis due to pollen: Secondary | ICD-10-CM | POA: Diagnosis not present

## 2018-04-21 DIAGNOSIS — J069 Acute upper respiratory infection, unspecified: Secondary | ICD-10-CM | POA: Diagnosis not present

## 2018-04-21 DIAGNOSIS — Z6841 Body Mass Index (BMI) 40.0 and over, adult: Secondary | ICD-10-CM | POA: Diagnosis not present

## 2018-05-31 DIAGNOSIS — Z3009 Encounter for other general counseling and advice on contraception: Secondary | ICD-10-CM | POA: Diagnosis not present

## 2018-07-15 DIAGNOSIS — Z302 Encounter for sterilization: Secondary | ICD-10-CM | POA: Diagnosis not present

## 2018-10-05 DIAGNOSIS — J302 Other seasonal allergic rhinitis: Secondary | ICD-10-CM | POA: Diagnosis not present

## 2018-10-05 DIAGNOSIS — Z1389 Encounter for screening for other disorder: Secondary | ICD-10-CM | POA: Diagnosis not present

## 2018-10-05 DIAGNOSIS — Z6841 Body Mass Index (BMI) 40.0 and over, adult: Secondary | ICD-10-CM | POA: Diagnosis not present

## 2018-10-05 DIAGNOSIS — J4520 Mild intermittent asthma, uncomplicated: Secondary | ICD-10-CM | POA: Diagnosis not present

## 2018-11-21 ENCOUNTER — Ambulatory Visit (INDEPENDENT_AMBULATORY_CARE_PROVIDER_SITE_OTHER): Payer: 59 | Admitting: Allergy

## 2018-11-21 ENCOUNTER — Other Ambulatory Visit: Payer: Self-pay

## 2018-11-21 ENCOUNTER — Encounter: Payer: Self-pay | Admitting: Allergy

## 2018-11-21 VITALS — BP 110/74 | HR 95 | Temp 98.4°F | Resp 16 | Ht 70.0 in | Wt 288.0 lb

## 2018-11-21 DIAGNOSIS — H1013 Acute atopic conjunctivitis, bilateral: Secondary | ICD-10-CM

## 2018-11-21 DIAGNOSIS — J3089 Other allergic rhinitis: Secondary | ICD-10-CM | POA: Insufficient documentation

## 2018-11-21 DIAGNOSIS — R12 Heartburn: Secondary | ICD-10-CM

## 2018-11-21 DIAGNOSIS — J4540 Moderate persistent asthma, uncomplicated: Secondary | ICD-10-CM

## 2018-11-21 MED ORDER — SPIRIVA RESPIMAT 1.25 MCG/ACT IN AERS: 2.0000 | INHALATION_SPRAY | Freq: Every day | RESPIRATORY_TRACT | 5 refills | Status: DC

## 2018-11-21 MED ORDER — OLOPATADINE HCL 0.1 % OP SOLN: 1.0000 [drp] | Freq: Two times a day (BID) | OPHTHALMIC | 5 refills | Status: DC | PRN

## 2018-11-21 MED ORDER — EPINEPHRINE 0.3 MG/0.3ML IJ SOAJ: 0.3000 mg | Freq: Once | INTRAMUSCULAR | 2 refills | Status: AC

## 2018-11-21 MED ORDER — AZELASTINE-FLUTICASONE 137-50 MCG/ACT NA SUSP: 1.0000 | Freq: Two times a day (BID) | NASAL | 5 refills | Status: DC

## 2018-11-21 MED ORDER — QVAR REDIHALER 40 MCG/ACT IN AERB
2.0000 | INHALATION_SPRAY | Freq: Two times a day (BID) | RESPIRATORY_TRACT | 5 refills | Status: DC
Start: 2018-11-21 — End: 2019-08-18

## 2018-11-21 MED ORDER — ALBUTEROL SULFATE HFA 108 (90 BASE) MCG/ACT IN AERS: 2.0000 | INHALATION_SPRAY | RESPIRATORY_TRACT | 0 refills | Status: DC | PRN

## 2018-11-21 MED ORDER — AZELASTINE-FLUTICASONE 137-50 MCG/ACT NA SUSP: 1.0000 | Freq: Two times a day (BID) | NASAL | 1 refills | Status: DC

## 2018-11-21 MED FILL — AZELASTINE-FLUTICASONE 137-: 137-50 | 30 days supply | Qty: 23 | Fill #0

## 2018-11-21 NOTE — Assessment & Plan Note (Addendum)
Rhino conjunctivitis symptoms for the past 10+ years during the spring and fall. Tried xyzal, allegra, Singulair and eye drops with some benefit. Flonase did not help.   Today's skin testing showed: Positive to grass, ragweed, weed, tree pollen, cat.   Start environmental control measures.  May use over the counter antihistamines such as Zyrtec (cetirizine), Claritin (loratadine), Allegra (fexofenadine), or Xyzal (levocetirizine) daily as needed. May take twice a day if needed.   Continue Singulair 10mg  daily.  May use Pazeo 1 drop in each eye daily as needed for itchy/watery eyes.   Start dymista 1 spray twice a day. Let us know if coupon does not work.   Start allergy injections in [place] office once asthma is more stable.   Had a detailed discussion with patient/family that clinical history is suggestive of allergic rhinitis, and may benefit from allergy immunotherapy (AIT). Discussed in detail regarding the dosing, schedule, side effects (mild to moderate local allergic reaction and rarely systemic allergic reactions including anaphylaxis), and benefits (significant improvement in nasal symptoms, seasonal flares of asthma) of immunotherapy with the patient. There is significant time commitment involved with allergy shots, which includes weekly immunotherapy injections for first 9-12 months and then biweekly to monthly injections for 3-5 years. Consent was signed.

## 2018-11-21 NOTE — Assessment & Plan Note (Signed)
   See assessment and plan as above for allergic rhinitis.  

## 2018-11-21 NOTE — Progress Notes (Signed)
New Patient Note  RE: Frank Gilbert MRN: [id number] DOB: Jul 17, 1986 Date of Office Visit: 11/21/2018  Referring provider: Avis Epley, PA* Primary care provider: Assunta Found, MD  Chief Complaint: Allergic Rhinitis  and Asthma  History of Present Illness: I had the pleasure of seeing Frank Gilbert for initial evaluation at the [facility] on 11/21/2018. He is a 33 y.o. male, who is referred here by Assunta Found, MD for the evaluation of asthma and allergic rhinitis.  Asthma:  He reports symptoms of chest tightness, shortness of breath, coughing with post tussive emesis, wheezing  for 10+years. Current medications include albuterol prn and Qvar 40 1 puff once a day and Spiriva 1 puff once a day x few weeks which help. He reports not using aerochamber with asthma inhalers. He tried the following inhalers: none. Main asthma triggers are allergies, infections. In the last month, frequency of asthma symptoms: <1x/week. Frequency of nocturnal symptoms: 0x/month. Frequency of SABA use: <1x/week. Interference with physical activity: no. Sleep is undisturbed. In the last 12 months, emergency room visits/urgent care visits/doctor office visits or hospitalizations due to asthma: one. In the last 12 months, oral steroids courses: once. Lifetime history of hospitalization for asthma: no. Prior intubations: no. Asthma was diagnosed at age 72. History of pneumonia: twice as a child. He was not evaluated by allergist/pulmonologist in the past. Smoking exposure: quit in 2008, 1 pack per week for 5 years. Up to date with flu vaccine: no.  Allergic rhinitis: He reports symptoms of PND, sneezing, nasal congestion, rhinorrhea, itchy/watery eyes. Symptoms have been going on for 10+ years. The symptoms are present during the spring and fall. Other triggers include exposure to none. Anosmia: no. Headache: no. He has used Xyzal, allegra, Singulair, eye drops with some improvement in  symptoms. Tried Flonase with no benefit. Sinus infections: none. Previous work up includes: none. Previous ENT evaluation: none. Some reflux but not on any medications.  Assessment and Plan: Frank Gilbert is a 33 y.o. male with: Moderate persistent asthma without complication Asthma symptoms for 10+ years. Started on Qvar 40 1 puff QD and Spiriva 1.13mcg 1 puff QD x 2 weeks with unknown benefit. One course of oral steroid the past year.  Today's spirometry showed: consistent with possible restrictive disease and 18% improvement in FEV1 post bronchodilator treatment. Clinically felt improved. . Daily controller medication(s): Increase Spiriva 1.39mcg to 2 puffs once a day. o Increase Qvar 40 to 2 puffs twice a day and rinse mouth afterwards.   o Continue Singulair  daily.  . Prior to physical activity: May use albuterol rescue inhaler 2 puffs 5 to 15 minutes prior to strenuous physical activities. Marland Kitchen Rescue medications: May use albuterol rescue inhaler 2 puffs or nebulizer every 4 to 6 hours as needed for shortness of breath, chest tightness, coughing, and wheezing. Monitor frequency of use.  . Repeat spirometry at next visit.   Other allergic rhinitis Rhino conjunctivitis symptoms for the past 10+ years during the spring and fall. Tried xyzal, allegra, Singulair and eye drops with some benefit. Flonase did not help.   Today's skin testing showed: Positive to grass, ragweed, weed, tree pollen, cat.   Start environmental control measures.  May use over the counter antihistamines such as Zyrtec (cetirizine), Claritin (loratadine), Allegra (fexofenadine), or Xyzal (levocetirizine) daily as needed. May take twice a day if needed.   Continue Singulair  daily.  May use Pazeo 1 drop in each eye daily as needed for itchy/watery  eyes.   Start dymista 1 spray twice a day. Let us know if coupon does not work.   Start allergy injections in [place] office once asthma is more stable.   Had  a detailed discussion with patient/family that clinical history is suggestive of allergic rhinitis, and may benefit from allergy immunotherapy (AIT). Discussed in detail regarding the dosing, schedule, side effects (mild to moderate local allergic reaction and rarely systemic allergic reactions including anaphylaxis), and benefits (significant improvement in nasal symptoms, seasonal flares of asthma) of immunotherapy with the patient. There is significant time commitment involved with allergy shots, which includes weekly immunotherapy injections for first 9-12 months and then biweekly to monthly injections for 3-5 years. Consent was signed.   Allergic conjunctivitis of both eyes  See assessment and plan as above for allergic rhinitis.  Return in about 4 weeks (around 12/19/2018).  Meds ordered this encounter  Medications  . DISCONTD: Azelastine-Fluticasone (DYMISTA) 137-50 MCG/ACT SUSP    Sig: Place 1 spray into both nostrils 2 (two) times a day.    Dispense:  1 Bottle    Refill:  5  . EPINEPHrine (EPIPEN 2-PAK) 0.3 mg/0.3 mL IJ SOAJ injection    Sig: Inject 0.3 mLs (0.3 mg total) into the muscle once for 1 dose.    Dispense:  2 Device    Refill:  2    Home Phone: [phone]  . olopatadine (PATANOL) 0.1 % ophthalmic solution    Sig: Place 1 drop into both eyes 2 (two) times daily as needed (itchy/watery eyes).    Dispense:  5 mL    Refill:  5  . Azelastine-Fluticasone (DYMISTA) 137-50 MCG/ACT SUSP    Sig: Place 1 spray into both nostrils 2 (two) times a day.    Dispense:  69 g    Refill:  1   Other allergy screening: Food allergy: no Medication allergy: yes  Penicillin - rash Oxycodone - rash  Hymenoptera allergy: no Urticaria: no Eczema:no History of recurrent infections suggestive of immunodeficency: no  Diagnostics: Spirometry:  Tracings reviewed. His effort: Good reproducible efforts. FVC: 3.34L FEV1: 2.81L, 64% predicted FEV1/FVC ratio: 84% Interpretation: Spirometry  consistent with possible restrictive disease and 18% improvement in FEV1 post bronchodilator treatment.  Please see scanned spirometry results for details.  Skin Testing: Environmental allergy panel. Positive test to: grass, ragweed, weed, tree pollen, cat. Results discussed with patient/family. Airborne Adult Perc - 11/21/18 1429    Time Antigen Placed  1429    Allergen Manufacturer  Waynette Buttery    Location  Back    Number of Test  59    Panel 1  Select    1. Control-Buffer 50% Glycerol  Negative    2. Control-Histamine 1 mg/ml  2+    3. Albumin saline  Negative    4. Bahia  4+    5. [place]  4+    6. Johnson  4+    7. [place] Blue  4+    8. Meadow Fescue  4+    9. Perennial Rye  4+    10. Sweet Vernal  4+    11. Timothy  4+    12. Cocklebur  2+    13. Burweed Marshelder  2+    14. Ragweed, short  4+    15. Ragweed, Giant  4+    16. Plantain,  English  2+    17. Lamb's Quarters  2+    18. Sheep Sorrell  2+    19. Rough Pigweed  3+  20. Marsh Elder, Rough  2+    21. Mugwort, Common  3+    22. Ash mix  3+    23. Birch mix  3+    24. Beech American  4+    25. Box, Elder  4+    26. Cedar, red  Negative    27. Cottonwood, Eastern  4+    28. Elm mix  3+    29. Hickory mix  Negative    30. Maple mix  2+    31. Oak, Guinea-Biss[place]astern mix  4+    32. Pecan Pollen  4+    33. Pine mix  2+    34. Sycamore Eastern  4+    35. Walnut, Black Pollen  4+    36. Alternaria alternata  Negative    37. Cladosporium Herbarum  Negative    38. Aspergillus mix  Negative    39. Penicillium mix  Negative    40. Bipolaris sorokiniana (Helminthosporium)  Negative    41. Drechslera spicifera (Curvularia)  Negative    42. Mucor plumbeus  Negative    43. Fusarium moniliforme  Negative    44. Aureobasidium pullulans (pullulara)  Negative    45. Rhizopus oryzae  Negative    46. Botrytis cinera  Negative    47. Epicoccum nigrum  Negative    48. Phoma betae  Negative    49. Candida Albicans  Negative     50. Trichophyton mentagrophytes  Negative    51. Mite, D Farinae  5,000 AU/ml  Negative    52. Mite, D Pteronyssinus  5,000 AU/ml  Negative    53. Cat Hair 10,000 BAU/ml  Negative    54.  Dog Epithelia  Negative    55. Mixed Feathers  Negative    56. Horse Epithelia  Negative    57. Cockroach, German  Negative    58. Mouse  Negative    59. Tobacco Leaf  Negative     Intradermal - 11/21/18 1459    Time Antigen Placed  1459    Allergen Manufacturer  Waynette ButteryGreer    Location  Arm    Number of Test  9    Intradermal  Select    Control  Negative    Mold 1  Negative    Mold 2  Negative    Mold 3  Negative    Mold 4  Negative    Cat  3+    Dog  Negative    Cockroach  Negative    Mite mix  Negative       Past Medical History: Patient Active Problem List   Diagnosis Date Noted  . Other allergic rhinitis 11/21/2018  . Heartburn 11/21/2018  . Moderate persistent asthma without complication 11/21/2018  . Allergic conjunctivitis of both eyes 11/21/2018   Past Medical History:  Diagnosis Date  . Asthma   . Headache    migraines  . Kidney stone    right ureteral stone  . Sleep apnea    no longer uses CPAP   Past Surgical History: Past Surgical History:  Procedure Laterality Date  . LITHOTRIPSY  06/2014   Medication List:  Current Outpatient Medications  Medication Sig Dispense Refill  . benzonatate (TESSALON) 100 MG capsule daily  0  . dicyclomine (BENTYL) 10 MG capsule Take 1 capsule (10 mg total) by mouth 3 (three) times daily before meals. 90 capsule 5  . diphenhydrAMINE (BENADRYL) 25 MG tablet Take 25 mg by mouth as needed for allergies.    .Marland Kitchen  diphenoxylate-atropine (LOMOTIL) 2.5-0.025 MG tablet Take 2 tablets by mouth 4 (four) times daily as needed for diarrhea or loose stools. 30 tablet 0  . ibuprofen (ADVIL,MOTRIN) 200 MG tablet Take 200 mg by mouth as needed.    Marland Kitchen levocetirizine (XYZAL) 5 MG tablet Take 5 mg by mouth every morning.      . montelukast (SINGULAIR) 10 MG  tablet Take 10 mg by mouth at bedtime.    . ondansetron (ZOFRAN ODT) 4 MG disintegrating tablet  ODT q4 hours prn nausea/vomit 20 tablet 1  . QVAR REDIHALER 40 MCG/ACT inhaler     . SPIRIVA RESPIMAT 1.25 MCG/ACT AERS     . Tetrahydrozoline HCl (EYE DROPS OP) Apply 1 drop to eye daily.      . Azelastine-Fluticasone (DYMISTA) 137-50 MCG/ACT SUSP Place 1 spray into both nostrils 2 (two) times a day. 69 g 1  . EPINEPHrine (EPIPEN 2-PAK) 0.3 mg/0.3 mL IJ SOAJ injection Inject 0.3 mLs (0.3 mg total) into the muscle once for 1 dose. 2 Device 2  . olopatadine (PATANOL) 0.1 % ophthalmic solution Place 1 drop into both eyes 2 (two) times daily as needed (itchy/watery eyes). 5 mL 5   No current facility-administered medications for this visit.    Allergies: Allergies  Allergen Reactions  . Oxycodone Itching and Rash  . Penicillins Rash   Social History: Social History   Socioeconomic History  . Marital status: Married    Spouse name: Not on file  . Number of children: Not on file  . Years of education: Not on file  . Highest education level: Not on file  Occupational History  . Not on file  Social Needs  . Financial resource strain: Not on file  . Food insecurity:    Worry: Not on file    Inability: Not on file  . Transportation needs:    Medical: Not on file    Non-medical: Not on file  Tobacco Use  . Smoking status: Former Smoker    Last attempt to quit: 04/03/2008    Years since quitting: 10.6  . Smokeless tobacco: Former Neurosurgeon    Quit date: 04/03/2005  Substance and Sexual Activity  . Alcohol use: Yes    Alcohol/week: 0.0 standard drinks    Comment: Rare  . Drug use: No  . Sexual activity: Yes  Lifestyle  . Physical activity:    Days per week: Not on file    Minutes per session: Not on file  . Stress: Not on file  Relationships  . Social connections:    Talks on phone: Not on file    Gets together: Not on file    Attends religious service: Not on file    Active member  of club or organization: Not on file    Attends meetings of clubs or organizations: Not on file    Relationship status: Not on file  Other Topics Concern  . Not on file  Social History Narrative  . Not on file   Lives in 35 year old home. Smoking: smokes from 2003 to 2008 1 pack per week Occupation: Occupational hygienist History: Water Damage/mildew in the house: no Engineer, civil (consulting) in the family room: yes Carpet in the bedroom: yes Heating: electric Cooling: central Pet: no  Family History: Family History  Problem Relation Age of Onset  . Migraines Mother   . Healthy Father   . Healthy Sister   . Healthy Sister   . Healthy Child    Problem  Relation Asthma                                   No  Eczema                                Mother  Food allergy                          No  Allergic rhino conjunctivitis     No   Review of Systems  Constitutional: Negative for appetite change, chills, fever and unexpected weight change.  HENT: Positive for postnasal drip. Negative for congestion and rhinorrhea.   Eyes: Negative for itching.  Respiratory: Positive for cough. Negative for chest tightness, shortness of breath and wheezing.   Cardiovascular: Negative for chest pain.  Gastrointestinal: Negative for abdominal pain.  Genitourinary: Negative for difficulty urinating.  Skin: Negative for rash.  Allergic/Immunologic: Positive for environmental allergies. Negative for food allergies.  Neurological: Negative for headaches.   Objective: BP 110/74   Pulse 95   Temp 98.4 F (36.9 C) (Tympanic)   Resp 16   Ht 5\' 10"  (1.778 m)   Wt 288 lb (130.6 kg)   SpO2 95%   BMI 41.32 kg/m  Body mass index is 41.32 kg/m. Physical Exam  Constitutional: He is oriented to person, place, and time. He appears well-developed and well-nourished.  HENT:  Head: Normocephalic and atraumatic.  Right Ear: External ear normal.  Left Ear: External ear normal.   Nose: Nose normal.  Mouth/Throat: Oropharynx is clear and moist.  Eyes: Conjunctivae and EOM are normal.  Neck: Neck supple.  Cardiovascular: Normal rate, regular rhythm and normal heart sounds. Exam reveals no gallop and no friction rub.  No murmur heard. Pulmonary/Chest: Effort normal and breath sounds normal. He has no wheezes. He has no rales.  Abdominal: Soft.  Neurological: He is alert and oriented to person, place, and time.  Skin: Skin is warm. No rash noted.  Psychiatric: He has a normal mood and affect. His behavior is normal.  Nursing note and vitals reviewed.  The plan was reviewed with the patient/family, and all questions/concerned were addressed.  It was my pleasure to see Frank Gilbert today and participate in his care. Please feel free to contact me with any questions or concerns.  Sincerely,  Wyline Mood, DO Allergy & Immunology  [facility] office: [phone] [facility] office: [phone]

## 2018-11-21 NOTE — Patient Instructions (Signed)
Today's skin testing showed: Positive to grass, ragweed, weed, tree pollen, cat.   Asthma: . Daily controller medication(s): increase Spiriva 1.21mcg to 2 puffs once a day. o Increase Qvar 40 to 2 puffs twice a day and rinse mouth afterwards.  . Prior to physical activity: May use albuterol rescue inhaler 2 puffs 5 to 15 minutes prior to strenuous physical activities. Marland Kitchen Rescue medications: May use albuterol rescue inhaler 2 puffs or nebulizer every 4 to 6 hours as needed for shortness of breath, chest tightness, coughing, and wheezing. Monitor frequency of use.  . Asthma control goals:  o Full participation in all desired activities (may need albuterol before activity) o Albuterol use two times or less a week on average (not counting use with activity) o Cough interfering with sleep two times or less a month o Oral steroids no more than once a year o No hospitalizations  Allergic rhinitis:  Start environmental control measures.  May use over the counter antihistamines such as Zyrtec (cetirizine), Claritin (loratadine), Allegra (fexofenadine), or Xyzal (levocetirizine) daily as needed. May take twice a day if needed.   Continue Singulair 10mg  daily.  May use Pazeo 1 drop in each eye daily as needed for itchy/watery eyes.   Start dymista 1 spray twice a day.   Had a detailed discussion with patient/family that clinical history is suggestive of allergic rhinitis, and may benefit from allergy immunotherapy (AIT). Discussed in detail regarding the dosing, schedule, side effects (mild to moderate local allergic reaction and rarely systemic allergic reactions including anaphylaxis), and benefits (significant improvement in nasal symptoms, seasonal flares of asthma) of immunotherapy with the patient. There is significant time commitment involved with allergy shots, which includes weekly immunotherapy injections for first 9-12 months and then biweekly to monthly injections for 3-5 years.    Heartburn:   Minimize spicy foods, alcohol, caffeine, carbonated beverages.   Try not to eat/drink 2-3 hours before bedtime.   Follow up in 1 months  Reducing Pollen Exposure . Pollen seasons: trees (spring), grass (summer) and ragweed/weeds (fall). Marland Kitchen Keep windows closed in your home and car to lower pollen exposure.  Lilian Kapur air conditioning in the bedroom and throughout the house if possible.  . Avoid going out in dry windy days - especially early morning. . Pollen counts are highest between 5 - 10 AM and on dry, hot and windy days.  . Save outside activities for late afternoon or after a heavy rain, when pollen levels are lower.  . Avoid mowing of grass if you have grass pollen allergy. Marland Kitchen Be aware that pollen can also be transported indoors on people and pets.  . Dry your clothes in an automatic dryer rather than hanging them outside where they might collect pollen.  . Rinse hair and eyes before bedtime.  Pet Allergen Avoidance: . Contrary to popular opinion, there are no "hypoallergenic" breeds of dogs or cats. That is because people are not allergic to an animal's hair, but to an allergen found in the animal's saliva, dander (dead skin flakes) or urine. Pet allergy symptoms typically occur within minutes. For some people, symptoms can build up and become most severe 8 to 12 hours after contact with the animal. People with severe allergies can experience reactions in public places if dander has been transported on the pet owners' clothing. Marland Kitchen Keeping an animal outdoors is only a partial solution, since homes with pets in the yard still have higher concentrations of animal allergens. . Before getting a pet, ask  your allergist to determine if you are allergic to animals. If your pet is already considered part of your family, try to minimize contact and keep the pet out of the bedroom and other rooms where you spend a great deal of time. . As with dust mites, vacuum carpets often or  replace carpet with a hardwood floor, tile or linoleum. . High-efficiency particulate air (HEPA) cleaners can reduce allergen levels over time. . While dander and saliva are the source of cat and dog allergens, urine is the source of allergens from rabbits, hamsters, mice and [place]gu[place] pigs; so ask a non-allergic family member to clean the animal's cage. . If you have a pet allergy, talk to your allergist about the potential for allergy immunotherapy (allergy shots). This strategy can often provide long-term relief.

## 2018-11-21 NOTE — Addendum Note (Signed)
Addended by: Teressa Senter on: 11/21/2018 04:32 PM   Modules accepted: Orders

## 2018-11-21 NOTE — Assessment & Plan Note (Addendum)
Asthma symptoms for 10+ years. Started on Qvar 40 1 puff QD and Spiriva 1.68mcg 1 puff QD x 2 weeks with unknown benefit. One course of oral steroid the past year.  Today's spirometry showed: consistent with possible restrictive disease and 18% improvement in FEV1 post bronchodilator treatment. Clinically felt improved. . Daily controller medication(s): Increase Spiriva 1.11mcg to 2 puffs once a day. o Increase Qvar 40 to 2 puffs twice a day and rinse mouth afterwards.   o Continue Singulair 10mg  daily.  . Prior to physical activity: May use albuterol rescue inhaler 2 puffs 5 to 15 minutes prior to strenuous physical activities. Marland Kitchen Rescue medications: May use albuterol rescue inhaler 2 puffs or nebulizer every 4 to 6 hours as needed for shortness of breath, chest tightness, coughing, and wheezing. Monitor frequency of use.  . Repeat spirometry at next visit.

## 2018-11-22 DIAGNOSIS — J301 Allergic rhinitis due to pollen: Secondary | ICD-10-CM | POA: Diagnosis not present

## 2018-11-22 NOTE — Progress Notes (Signed)
VIALS EXP 11-22-2019 

## 2018-11-23 DIAGNOSIS — J301 Allergic rhinitis due to pollen: Secondary | ICD-10-CM | POA: Diagnosis not present

## 2018-12-02 DIAGNOSIS — G4733 Obstructive sleep apnea (adult) (pediatric): Secondary | ICD-10-CM | POA: Diagnosis not present

## 2018-12-07 ENCOUNTER — Other Ambulatory Visit: Payer: Self-pay

## 2018-12-07 ENCOUNTER — Ambulatory Visit (INDEPENDENT_AMBULATORY_CARE_PROVIDER_SITE_OTHER): Payer: 59

## 2018-12-07 DIAGNOSIS — J309 Allergic rhinitis, unspecified: Secondary | ICD-10-CM | POA: Diagnosis not present

## 2018-12-07 NOTE — Progress Notes (Signed)
Immunotherapy   Patient Details  Name: Frank Gilbert MRN: [id number] Date of Birth: February 01, 1986  12/07/2018  [name] started allergy injections today. Patient received 0.05 of both his blue vials. One with Grass-Weeds-RW and the other with Tree-Cat. Patient waited in office for 30 minutes with no problems. Following schedule: A Frequency: once weekly Epi-Pen: Yes Consent signed and patient instructions given.   Dub Mikes 12/07/2018, 3:06 PM

## 2018-12-16 ENCOUNTER — Ambulatory Visit (INDEPENDENT_AMBULATORY_CARE_PROVIDER_SITE_OTHER): Payer: 59

## 2018-12-16 DIAGNOSIS — J309 Allergic rhinitis, unspecified: Secondary | ICD-10-CM

## 2018-12-23 ENCOUNTER — Ambulatory Visit (INDEPENDENT_AMBULATORY_CARE_PROVIDER_SITE_OTHER): Payer: 59

## 2018-12-23 DIAGNOSIS — J309 Allergic rhinitis, unspecified: Secondary | ICD-10-CM

## 2018-12-26 ENCOUNTER — Ambulatory Visit: Payer: 59 | Admitting: Allergy

## 2018-12-26 NOTE — Progress Notes (Deleted)
RE: Frank Gilbert MRN: [id number] DOB: 01-Sep-1985 Date of Telemedicine Visit: 12/26/2018  Referring provider: Assunta Found, MD Primary care provider: Assunta Found, MD  Chief Complaint: No chief complaint on file.   Telemedicine Follow Up Visit via Telephone: I connected with Frank Gilbert for a follow up on 12/26/18 by telephone and verified that I am speaking with the correct person using two identifiers.   I discussed the limitations, risks, security and privacy concerns of performing an evaluation and management service by telephone and the availability of in person appointments. I also discussed with the patient that there may be a patient responsible charge related to this service. The patient expressed understanding and agreed to proceed.  Patient is at *** accompanied by *** who provided/contributed to the history.  Provider is at the office.  Visit start time: *** Visit end time: *** Insurance consent/check in by: *** Medical consent and medical assistant/nurse: ***  History of Present Illness: He is a 33 y.o. male, who is being followed for asthma, allergic rhino conjunctivitis. His previous allergy office visit was on 11/21/2018 with Dr. Selena Batten. Today is a regular follow up visit.  Moderate persistent asthma without complication Asthma symptoms for 10+ years. Started on Qvar 40 1 puff QD and Spiriva 1.45mcg 1 puff QD x 2 weeks with unknown benefit. One course of oral steroid the past year.  Today's spirometry showed: consistent with possible restrictive disease and 18% improvement in FEV1 post bronchodilator treatment. Clinically felt improved.  Daily controller medication(s):Increase Spiriva 1.37mcg to 2 puffs once a day. ? Increase Qvar 40 to 2 puffs twice a day and rinse mouth afterwards.   ? Continue Singulair 10mg  daily.   Prior to physical activity:May use albuterol rescue inhaler 2 puffs 5 to 15 minutes prior to strenuous physical activities.  Rescue  medications:May use albuterol rescue inhaler 2 puffs or nebulizer every 4 to 6 hours as needed for shortness of breath, chest tightness, coughing, and wheezing. Monitor frequency of use.   Repeat spirometry at next visit.   Other allergic rhinitis Rhino conjunctivitis symptoms for the past 10+ years during the spring and fall. Tried xyzal, allegra, Singulair and eye drops with some benefit. Flonase did not help.   Today's skin testing showed: Positive to grass, ragweed, weed, tree pollen, cat.   Start environmental control measures.  May use over the counter antihistamines such as Zyrtec (cetirizine), Claritin (loratadine), Allegra (fexofenadine), or Xyzal (levocetirizine) daily as needed. May take twice a day if needed.   Continue Singulair 10mg  daily.  May use Pazeo 1 drop in each eye daily as needed for itchy/watery eyes.   Start dymista 1 spray twice a day. Let us know if coupon does not work.   Start allergy injections in [place] office once asthma is more stable.   Had a detailed discussion with patient/family that clinical history is suggestive of allergic rhinitis, and may benefit from allergy immunotherapy (AIT). Discussed in detail regarding the dosing, schedule, side effects (mild to moderate local allergic reaction and rarely systemic allergic reactions including anaphylaxis), and benefits (significant improvement in nasal symptoms, seasonal flares of asthma) of immunotherapy with the patient. There is significant time commitment involved with allergy shots, which includes weekly immunotherapy injections for first 9-12 months and then biweekly to monthly injections for 3-5 years. Consent was signed.   Allergic conjunctivitis of both eyes  See assessment and plan as above for allergic rhinitis.  Assessment and Plan: Frank Gilbert is a 33 y.o. male with: No  problem-specific Assessment & Plan notes found for this encounter.  No follow-ups on file.  No orders of the  defined types were placed in this encounter.  Lab Orders  No laboratory test(s) ordered today    Diagnostics: None.  Medication List:  Current Outpatient Medications  Medication Sig Dispense Refill  . albuterol (PROAIR HFA) 108 (90 Base) MCG/ACT inhaler Inhale 2 puffs into the lungs every 4 (four) hours as needed. 1 Inhaler 0  . Azelastine-Fluticasone (DYMISTA) 137-50 MCG/ACT SUSP Place 1 spray into both nostrils 2 (two) times a day. 69 g 1  . benzonatate (TESSALON) 100 MG capsule daily  0  . dicyclomine (BENTYL) 10 MG capsule Take 1 capsule (10 mg total) by mouth 3 (three) times daily before meals. 90 capsule 5  . diphenhydrAMINE (BENADRYL) 25 MG tablet Take 25 mg by mouth as needed for allergies.    . diphenoxylate-atropine (LOMOTIL) 2.5-0.025 MG tablet Take 2 tablets by mouth 4 (four) times daily as needed for diarrhea or loose stools. 30 tablet 0  . ibuprofen (ADVIL,MOTRIN) 200 MG tablet Take 200 mg by mouth as needed.    Marland Kitchen. levocetirizine (XYZAL) 5 MG tablet Take 5 mg by mouth every morning.      . montelukast (SINGULAIR) 10 MG tablet Take 10 mg by mouth at bedtime.    Marland Kitchen. olopatadine (PATANOL) 0.1 % ophthalmic solution Place 1 drop into both eyes 2 (two) times daily as needed (itchy/watery eyes). 5 mL 5  . ondansetron (ZOFRAN ODT) 4 MG disintegrating tablet 4mg  ODT q4 hours prn nausea/vomit 20 tablet 1  . QVAR REDIHALER 40 MCG/ACT inhaler Inhale 2 puffs into the lungs 2 (two) times daily. 1 Inhaler 5  . SPIRIVA RESPIMAT 1.25 MCG/ACT AERS Inhale 2 puffs into the lungs daily. 1 Inhaler 5  . Tetrahydrozoline HCl (EYE DROPS OP) Apply 1 drop to eye daily.       No current facility-administered medications for this visit.    Allergies: Allergies  Allergen Reactions  . Oxycodone Itching and Rash  . Penicillins Rash   I reviewed his past medical history, social history, family history, and environmental history and no significant changes have been reported from previous visit on  11/21/2018.  Review of Systems  Constitutional: Negative for appetite change, chills, fever and unexpected weight change.  HENT: Positive for postnasal drip. Negative for congestion and rhinorrhea.   Eyes: Negative for itching.  Respiratory: Positive for cough. Negative for chest tightness, shortness of breath and wheezing.   Cardiovascular: Negative for chest pain.  Gastrointestinal: Negative for abdominal pain.  Genitourinary: Negative for difficulty urinating.  Skin: Negative for rash.  Allergic/Immunologic: Positive for environmental allergies. Negative for food allergies.  Neurological: Negative for headaches.   Objective: Physical Exam Not obtained as encounter was done via telephone.   Previous notes and tests were reviewed.  I discussed the assessment and treatment plan with the patient. The patient was provided an opportunity to ask questions and all were answered. The patient agreed with the plan and demonstrated an understanding of the instructions. After visit summary/patient instructions available via e-mail.   The patient was advised to call back or seek an in-person evaluation if the symptoms worsen or if the condition fails to improve as anticipated.  I provided *** minutes of non-face-to-face time during this encounter.  It was my pleasure to participate in Frank Gilbert's care today. Please feel free to contact me with any questions or concerns.   Sincerely,  Wyline MoodYoon Collyn Ribas, DO Allergy &  Immunology  [facility] office: [phone] [facility] office: [phone]

## 2018-12-30 ENCOUNTER — Ambulatory Visit (INDEPENDENT_AMBULATORY_CARE_PROVIDER_SITE_OTHER): Payer: 59

## 2018-12-30 DIAGNOSIS — J309 Allergic rhinitis, unspecified: Secondary | ICD-10-CM | POA: Diagnosis not present

## 2019-01-20 ENCOUNTER — Ambulatory Visit (INDEPENDENT_AMBULATORY_CARE_PROVIDER_SITE_OTHER): Payer: 59

## 2019-01-20 DIAGNOSIS — J309 Allergic rhinitis, unspecified: Secondary | ICD-10-CM | POA: Diagnosis not present

## 2019-01-25 ENCOUNTER — Ambulatory Visit (INDEPENDENT_AMBULATORY_CARE_PROVIDER_SITE_OTHER): Payer: 59

## 2019-01-25 DIAGNOSIS — J309 Allergic rhinitis, unspecified: Secondary | ICD-10-CM

## 2019-01-25 IMAGING — DX DG CHEST 2V
2 series · 2 of 2 positions shown · non-contrast
Comparison: 04/13/2009

CLINICAL DATA: Pt c/o nagging, chronic, dry cough and SOB. Hx of
asthma. Former smoker.

EXAM:
CHEST - 2 VIEW

[chest pa]
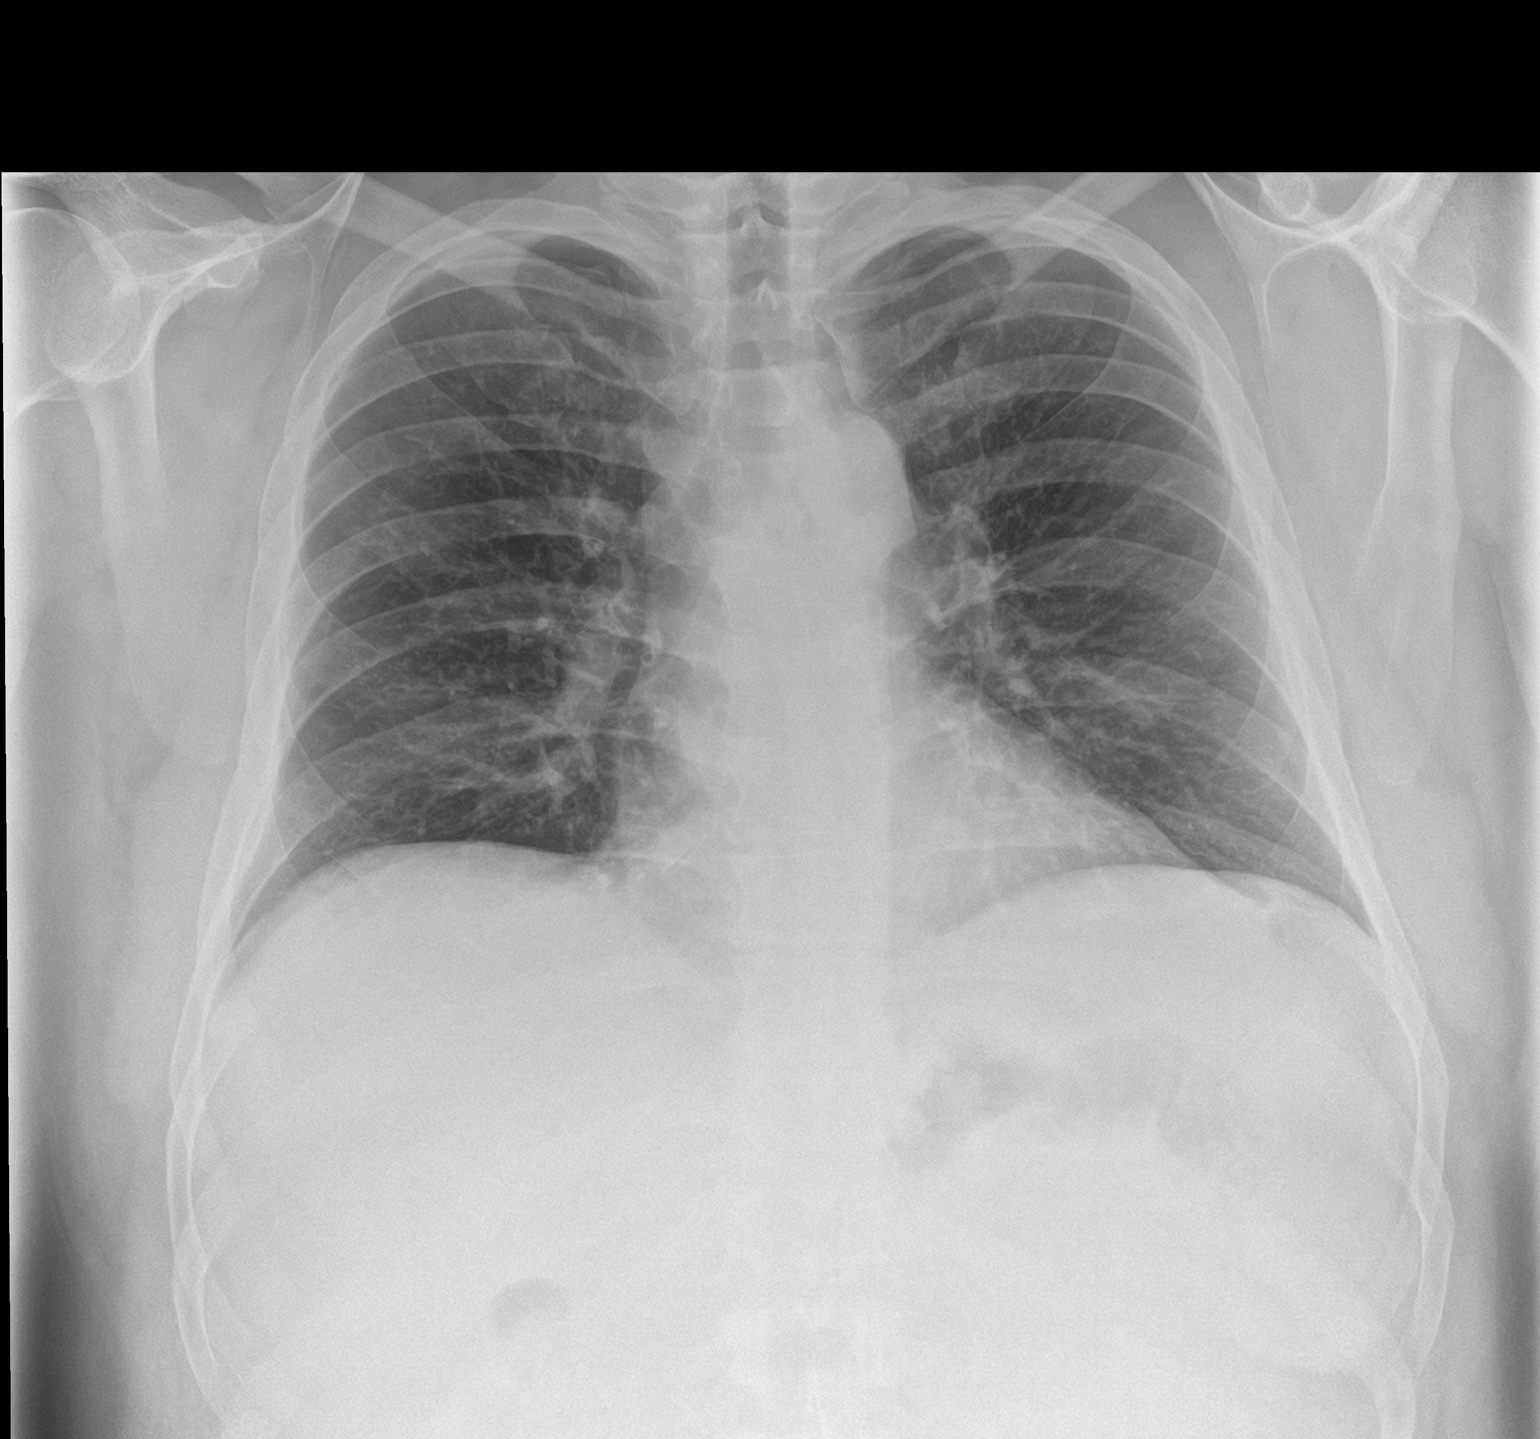

[chest lat]
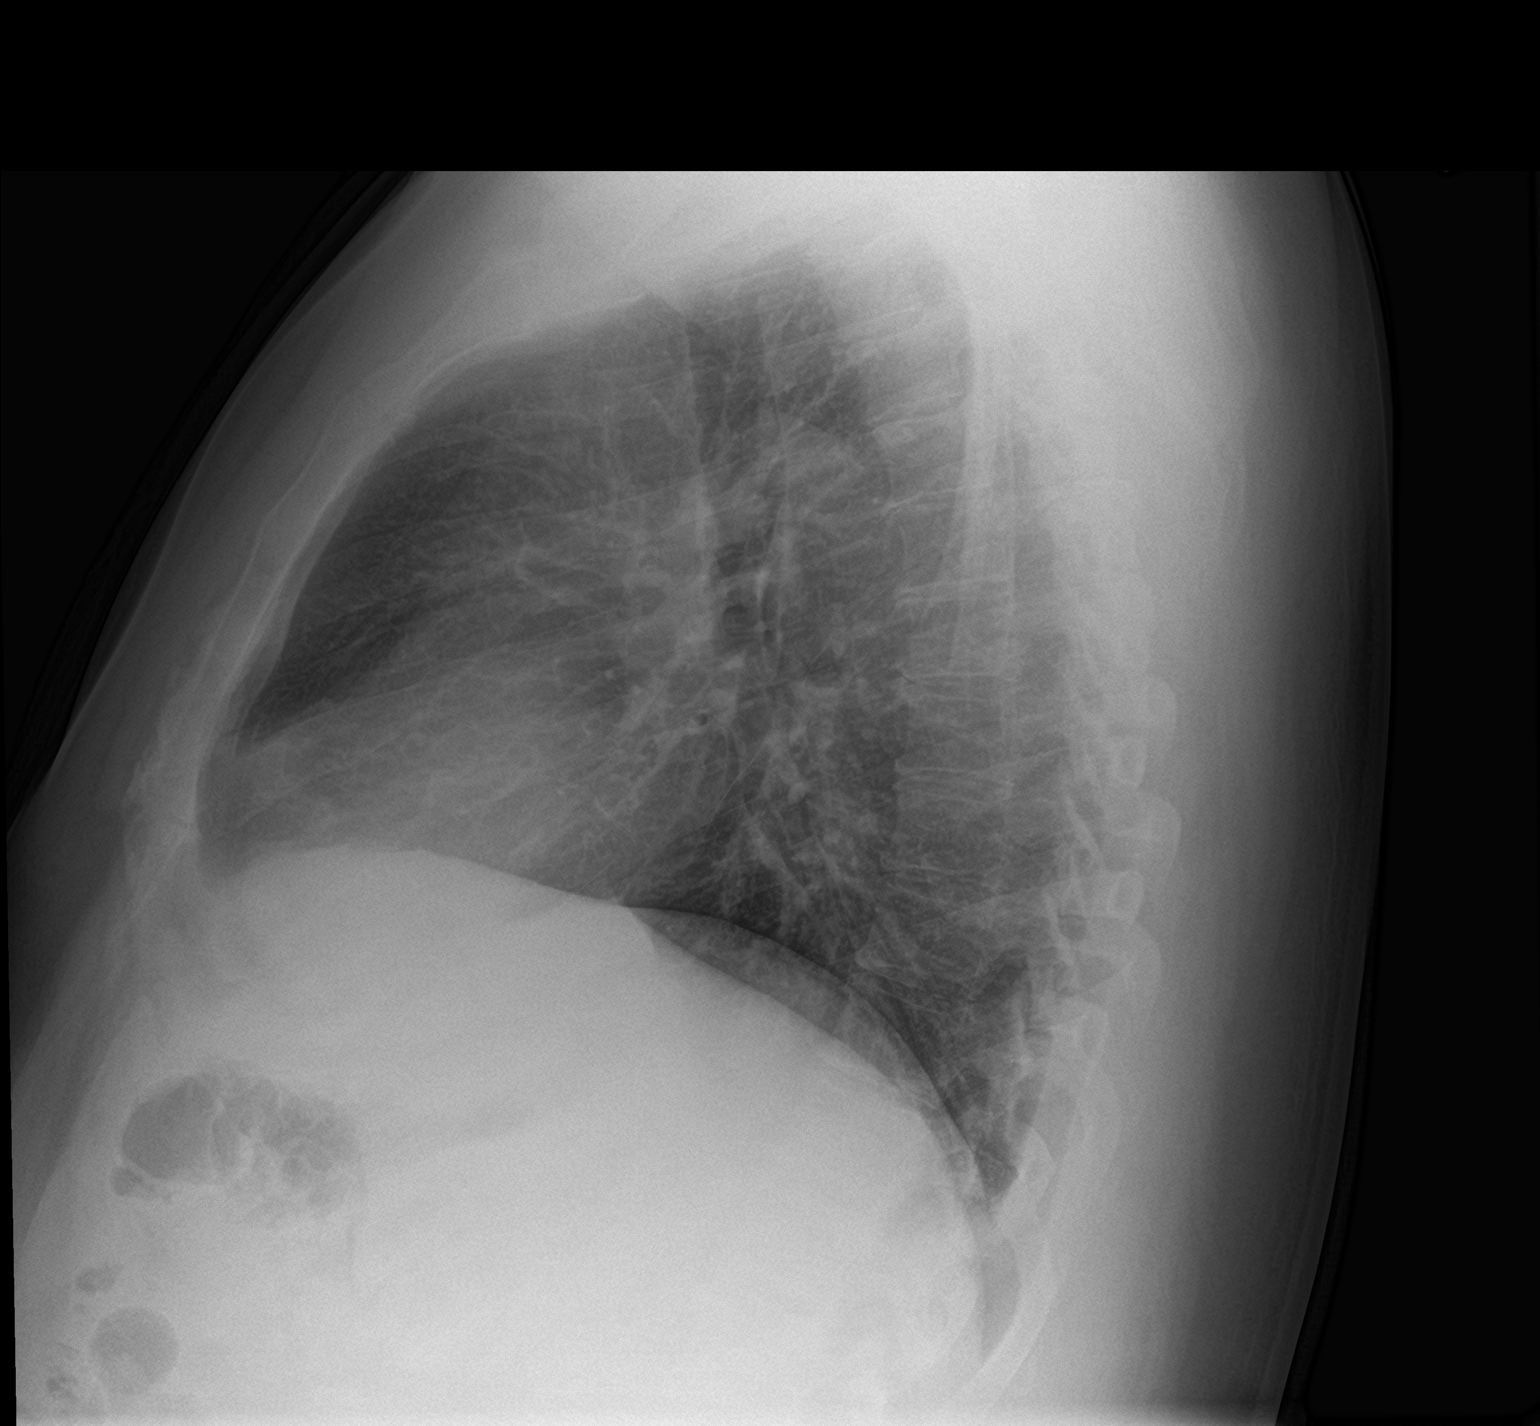

[2 of 2 positions shown; findings below may reference images not displayed]

FINDINGS: The heart size and mediastinal contours are within normal limits.
Both lungs are clear. The visualized skeletal structures are
unremarkable.
IMPRESSION: No active cardiopulmonary disease.

## 2019-02-03 ENCOUNTER — Other Ambulatory Visit: Payer: Self-pay

## 2019-02-03 ENCOUNTER — Ambulatory Visit (INDEPENDENT_AMBULATORY_CARE_PROVIDER_SITE_OTHER): Payer: 59

## 2019-02-03 DIAGNOSIS — J309 Allergic rhinitis, unspecified: Secondary | ICD-10-CM

## 2019-02-10 ENCOUNTER — Other Ambulatory Visit: Payer: Self-pay

## 2019-02-10 ENCOUNTER — Ambulatory Visit (INDEPENDENT_AMBULATORY_CARE_PROVIDER_SITE_OTHER): Payer: 59 | Admitting: *Deleted

## 2019-02-10 DIAGNOSIS — J309 Allergic rhinitis, unspecified: Secondary | ICD-10-CM | POA: Diagnosis not present

## 2019-02-23 ENCOUNTER — Ambulatory Visit (INDEPENDENT_AMBULATORY_CARE_PROVIDER_SITE_OTHER): Payer: 59

## 2019-02-23 ENCOUNTER — Other Ambulatory Visit: Payer: Self-pay

## 2019-02-23 DIAGNOSIS — J309 Allergic rhinitis, unspecified: Secondary | ICD-10-CM | POA: Diagnosis not present

## 2019-03-02 ENCOUNTER — Other Ambulatory Visit: Payer: Self-pay

## 2019-03-02 ENCOUNTER — Ambulatory Visit (INDEPENDENT_AMBULATORY_CARE_PROVIDER_SITE_OTHER): Payer: 59

## 2019-03-02 DIAGNOSIS — J309 Allergic rhinitis, unspecified: Secondary | ICD-10-CM | POA: Diagnosis not present

## 2019-03-09 ENCOUNTER — Other Ambulatory Visit: Payer: Self-pay

## 2019-03-09 ENCOUNTER — Ambulatory Visit (INDEPENDENT_AMBULATORY_CARE_PROVIDER_SITE_OTHER): Payer: 59

## 2019-03-09 DIAGNOSIS — J309 Allergic rhinitis, unspecified: Secondary | ICD-10-CM

## 2019-03-24 ENCOUNTER — Ambulatory Visit (INDEPENDENT_AMBULATORY_CARE_PROVIDER_SITE_OTHER): Payer: 59

## 2019-03-24 DIAGNOSIS — J309 Allergic rhinitis, unspecified: Secondary | ICD-10-CM | POA: Diagnosis not present

## 2019-04-07 ENCOUNTER — Ambulatory Visit (INDEPENDENT_AMBULATORY_CARE_PROVIDER_SITE_OTHER): Payer: 59

## 2019-04-07 DIAGNOSIS — J309 Allergic rhinitis, unspecified: Secondary | ICD-10-CM | POA: Diagnosis not present

## 2019-04-10 ENCOUNTER — Ambulatory Visit (HOSPITAL_COMMUNITY)
Admission: RE | Admit: 2019-04-10 | Discharge: 2019-04-10 | Disposition: A | Discharge status: Home / Self Care | Payer: 59 | Source: Ambulatory Visit | Attending: Family Medicine | Admitting: Family Medicine

## 2019-04-10 ENCOUNTER — Other Ambulatory Visit: Payer: Self-pay

## 2019-04-10 ENCOUNTER — Other Ambulatory Visit: Payer: Self-pay | Admitting: Family Medicine

## 2019-04-10 DIAGNOSIS — M79661 Pain in right lower leg: Secondary | ICD-10-CM | POA: Insufficient documentation

## 2019-04-10 DIAGNOSIS — Z6841 Body Mass Index (BMI) 40.0 and over, adult: Secondary | ICD-10-CM | POA: Diagnosis not present

## 2019-04-10 DIAGNOSIS — R600 Localized edema: Secondary | ICD-10-CM | POA: Diagnosis not present

## 2019-04-10 DIAGNOSIS — M7989 Other specified soft tissue disorders: Secondary | ICD-10-CM | POA: Diagnosis not present

## 2019-04-11 DIAGNOSIS — M7989 Other specified soft tissue disorders: Secondary | ICD-10-CM | POA: Diagnosis not present

## 2019-04-11 DIAGNOSIS — M79661 Pain in right lower leg: Secondary | ICD-10-CM | POA: Diagnosis not present

## 2019-04-11 DIAGNOSIS — Z6841 Body Mass Index (BMI) 40.0 and over, adult: Secondary | ICD-10-CM | POA: Diagnosis not present

## 2019-04-26 ENCOUNTER — Ambulatory Visit (INDEPENDENT_AMBULATORY_CARE_PROVIDER_SITE_OTHER): Payer: 59

## 2019-04-26 DIAGNOSIS — J309 Allergic rhinitis, unspecified: Secondary | ICD-10-CM | POA: Diagnosis not present

## 2019-05-03 ENCOUNTER — Ambulatory Visit (INDEPENDENT_AMBULATORY_CARE_PROVIDER_SITE_OTHER): Payer: 59

## 2019-05-03 DIAGNOSIS — J309 Allergic rhinitis, unspecified: Secondary | ICD-10-CM | POA: Diagnosis not present

## 2019-05-10 ENCOUNTER — Ambulatory Visit (INDEPENDENT_AMBULATORY_CARE_PROVIDER_SITE_OTHER): Payer: 59

## 2019-05-10 DIAGNOSIS — J309 Allergic rhinitis, unspecified: Secondary | ICD-10-CM | POA: Diagnosis not present

## 2019-05-17 ENCOUNTER — Ambulatory Visit (INDEPENDENT_AMBULATORY_CARE_PROVIDER_SITE_OTHER): Payer: 59

## 2019-05-17 DIAGNOSIS — J309 Allergic rhinitis, unspecified: Secondary | ICD-10-CM | POA: Diagnosis not present

## 2019-05-31 ENCOUNTER — Ambulatory Visit (INDEPENDENT_AMBULATORY_CARE_PROVIDER_SITE_OTHER): Payer: 59

## 2019-05-31 DIAGNOSIS — J309 Allergic rhinitis, unspecified: Secondary | ICD-10-CM | POA: Diagnosis not present

## 2019-06-14 ENCOUNTER — Ambulatory Visit (INDEPENDENT_AMBULATORY_CARE_PROVIDER_SITE_OTHER): Payer: 59

## 2019-06-14 DIAGNOSIS — J309 Allergic rhinitis, unspecified: Secondary | ICD-10-CM | POA: Diagnosis not present

## 2019-06-28 ENCOUNTER — Ambulatory Visit (INDEPENDENT_AMBULATORY_CARE_PROVIDER_SITE_OTHER): Payer: 59

## 2019-06-28 DIAGNOSIS — J309 Allergic rhinitis, unspecified: Secondary | ICD-10-CM | POA: Diagnosis not present

## 2019-07-05 ENCOUNTER — Ambulatory Visit (INDEPENDENT_AMBULATORY_CARE_PROVIDER_SITE_OTHER): Payer: 59 | Admitting: *Deleted

## 2019-07-05 DIAGNOSIS — J309 Allergic rhinitis, unspecified: Secondary | ICD-10-CM

## 2019-07-14 ENCOUNTER — Ambulatory Visit (INDEPENDENT_AMBULATORY_CARE_PROVIDER_SITE_OTHER): Payer: 59

## 2019-07-14 DIAGNOSIS — J309 Allergic rhinitis, unspecified: Secondary | ICD-10-CM | POA: Diagnosis not present

## 2019-07-19 ENCOUNTER — Ambulatory Visit (INDEPENDENT_AMBULATORY_CARE_PROVIDER_SITE_OTHER): Payer: 59

## 2019-07-19 DIAGNOSIS — J309 Allergic rhinitis, unspecified: Secondary | ICD-10-CM

## 2019-07-26 ENCOUNTER — Ambulatory Visit (INDEPENDENT_AMBULATORY_CARE_PROVIDER_SITE_OTHER): Payer: 59

## 2019-07-26 ENCOUNTER — Telehealth: Payer: Self-pay

## 2019-07-26 DIAGNOSIS — J309 Allergic rhinitis, unspecified: Secondary | ICD-10-CM

## 2019-07-26 NOTE — Telephone Encounter (Signed)
Can we do a quick televisit?  I just want to make sure that we do not have to be worried about periorbital cellulitis.  Maybe we can add him to the end of the day.  Salvatore Marvel, MD [facility] [place] [facility]

## 2019-07-26 NOTE — Telephone Encounter (Signed)
Patient came by today and he is having right eye swelling. His eye is very tender to touch red and has crust when he wakes up in the morning.  [facility]  Please Advise.

## 2019-07-26 NOTE — Telephone Encounter (Signed)
Will give him a call.   Thanks

## 2019-07-26 NOTE — Telephone Encounter (Signed)
Left a voicemail for the patient

## 2019-08-02 ENCOUNTER — Ambulatory Visit (INDEPENDENT_AMBULATORY_CARE_PROVIDER_SITE_OTHER): Payer: 59

## 2019-08-02 DIAGNOSIS — J309 Allergic rhinitis, unspecified: Secondary | ICD-10-CM

## 2019-08-09 ENCOUNTER — Ambulatory Visit (INDEPENDENT_AMBULATORY_CARE_PROVIDER_SITE_OTHER): Payer: 59

## 2019-08-09 DIAGNOSIS — J309 Allergic rhinitis, unspecified: Secondary | ICD-10-CM | POA: Diagnosis not present

## 2019-08-16 ENCOUNTER — Ambulatory Visit (INDEPENDENT_AMBULATORY_CARE_PROVIDER_SITE_OTHER): Payer: 59

## 2019-08-16 DIAGNOSIS — J309 Allergic rhinitis, unspecified: Secondary | ICD-10-CM

## 2019-08-18 ENCOUNTER — Encounter: Payer: Self-pay | Admitting: Allergy & Immunology

## 2019-08-18 ENCOUNTER — Other Ambulatory Visit: Payer: Self-pay

## 2019-08-18 ENCOUNTER — Ambulatory Visit (INDEPENDENT_AMBULATORY_CARE_PROVIDER_SITE_OTHER): Payer: 59 | Admitting: Allergy & Immunology

## 2019-08-18 VITALS — BP 136/84 | HR 85 | Temp 100.0°F | Resp 18

## 2019-08-18 DIAGNOSIS — J302 Other seasonal allergic rhinitis: Secondary | ICD-10-CM

## 2019-08-18 DIAGNOSIS — J4540 Moderate persistent asthma, uncomplicated: Secondary | ICD-10-CM

## 2019-08-18 DIAGNOSIS — J3089 Other allergic rhinitis: Secondary | ICD-10-CM | POA: Diagnosis not present

## 2019-08-18 DIAGNOSIS — H109 Unspecified conjunctivitis: Secondary | ICD-10-CM

## 2019-08-18 MED ORDER — OLOPATADINE HCL 0.1 % OP SOLN: 1.0000 [drp] | Freq: Two times a day (BID) | OPHTHALMIC | 12 refills | Status: DC | PRN

## 2019-08-18 MED ORDER — CIPROFLOXACIN HCL 0.3 % OP SOLN: 1.0000 [drp] | OPHTHALMIC | 0 refills | Status: AC

## 2019-08-18 NOTE — Patient Instructions (Addendum)
1. Bacterial conjunctivitis of right eye - I do not think that you need systemic antibiotics at this point, but I am going to send in an antibiotic eyedrop just to be on the safe side. - Since it seems to get worse with allergy shots, I conjecture that this is related to an allergic trigger. - Start the prednisone burst. - Start Pataday one drop per eye twice daily for one week and then as needed thereafter.  - Give [place] an update next week.   2. Return in about 1 year (around 08/17/2020). This can be an in-person, a virtual Webex or a telephone follow up visit.   Please inform us of any Emergency Department visits, hospitalizations, or changes in symptoms. Call us before going to the ED for breathing or allergy symptoms since we might be able to fit you in for a sick visit. Feel free to contact us anytime with any questions, problems, or concerns.  It was a pleasure to meet you today!  Websites that have reliable patient information: 1. American Academy of Asthma, Allergy, and Immunology: www.aaaai.org 2. Food Allergy Research and Education (FARE): foodallergy.org 3. Mothers of Asthmatics: http://www.asthmacommunitynetwork.org 4. American College of Allergy, Asthma, and Immunology: www.acaai.org   COVID-19 Vaccine Information can be found at: PodExchange.nl For questions related to vaccine distribution or appointments, please email vaccine@[email or web address] or call [phone].     "Like" [place] on Facebook and Instagram for our latest updates!        Make sure you are registered to vote! If you have moved or changed any of your contact information, you will need to get this updated before voting!  In some cases, you MAY be able to register to vote online: [email or web address]

## 2019-08-18 NOTE — Progress Notes (Signed)
FOLLOW UP  Date of Service/Encounter:  08/18/19   Assessment:   Bacterial conjunctivitis of right eye  Moderate persistent asthma without complication  Seasonal and perennial allergic rhinitis  Plan/Recommendations:   1. Bacterial conjunctivitis of right eye - I do not think that you need systemic antibiotics at this point, but I am going to send in an antibiotic eyedrop just to be on the safe side. - Since it seems to get worse with allergy shots, I conjecture that this is related to an allergic trigger. - Start the prednisone burst. - Start Pataday one drop per eye twice daily for one week and then as needed thereafter.  - Give [place] an update next week.   2. Return in about 1 year (around 08/17/2020). This can be an in-person, a virtual Webex or a telephone follow up visit.   Subjective:   Frank Gilbert is a 34 y.o. male presenting today for follow up of  Chief Complaint  Patient presents with  . Right Eye Irritation    Swollen, Itching and Tender x1 month    DRAYCE TAWIL has a history of the following: Patient Active Problem List   Diagnosis Date Noted  . Other allergic rhinitis 11/21/2018  . Heartburn 11/21/2018  . Moderate persistent asthma without complication 37/90/2409  . Allergic conjunctivitis of both eyes 11/21/2018    History obtained from: chart review and patient.  Gaynor is a 34 y.o. male presenting for a sick visit.  He was last seen in April 2020 by Dr. Maudie Mercury.  At that time, he had skin testing that was positive to grasses, ragweed, weeds, tree, and cat.  He did make the decision to start allergen immunotherapy.  He reached out at the end of December with concerns of conjunctivitis.  I inquired about doing a televisit so that we could be reassured regarding periorbital cellulitis.  It looks like the front desk left a voicemail at the end of December and he presents today for an office visit for the same reason.  In the interim, he  continues to have issues with ocular swelling and irritation. He has not associated it with anything in particular. Typically it will go away in one week but it has not at this point. He denies any fevers or pain with eye movement. This remains isolated to his right eye. He does report some yellow discharge which is present throughout the day. He has not been using any eye drops at all, although he did have some Pataday eye drops at one point. No one else in the family has similar symptoms. He denies any periorbital edema. He does not have sensitivity to light.  Allergy shots are going well. He has tolerated them without adverse event. He feels that there is some benefit to them, but the real test will be in the spring when the majority of his symptoms occur. He spends a lot of time outdoors maintaining their 17 acres of land. 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 His wife works per diem with [employer] and full time with [employer] with home based work.   Otherwise, there have been no changes to his past medical history, surgical history, family history, or social history.    Review of Systems  Constitutional: Negative.  Negative for chills, fever, malaise/fatigue and weight loss.  HENT: Negative.  Negative for congestion, ear discharge and ear pain.   Eyes: Positive for discharge and redness. Negative for  blurred vision, double vision, photophobia and pain.  Respiratory: Negative for cough, sputum production, shortness of breath and wheezing.   Cardiovascular: Negative.  Negative for chest pain and palpitations.  Gastrointestinal: Negative for abdominal pain, constipation, diarrhea, heartburn, nausea and vomiting.  Skin: Negative.  Negative for itching and rash.  Neurological: Negative for dizziness and headaches.  Endo/Heme/Allergies: Negative for environmental allergies. Does not bruise/bleed easily.       Objective:   Blood pressure 136/84, pulse 85, temperature  100 F (37.8 C), temperature source Temporal, resp. rate 18, SpO2 96 %. There is no height or weight on file to calculate BMI.   Physical Exam:  Physical Exam  Constitutional: He appears well-developed.  HENT:  Head: Normocephalic and atraumatic.  Right Ear: Tympanic membrane, external ear and ear canal normal.  Left Ear: Tympanic membrane and ear canal normal.  Nose: No mucosal edema, rhinorrhea, nasal deformity or septal deviation. No epistaxis. Right sinus exhibits no maxillary sinus tenderness and no frontal sinus tenderness. Left sinus exhibits no maxillary sinus tenderness and no frontal sinus tenderness.  Mouth/Throat: Uvula is midline and oropharynx is clear and moist. Mucous membranes are not pale and not dry.  Eyes: Pupils are equal, round, and reactive to light. EOM are normal. Right eye exhibits chemosis, discharge and exudate. Left eye exhibits no chemosis and no discharge. Right conjunctiva is injected. Left conjunctiva is not injected.  There is right sided conjunctival injection with some yellow discharge from right nasal bridge. EOMI. PERL. There is no periorbital edema appreciated.   Cardiovascular: Normal rate, regular rhythm and normal heart sounds.  Respiratory: Effort normal and breath sounds normal. No accessory muscle usage. No tachypnea. No respiratory distress. He has no wheezes. He has no rhonchi. He has no rales. He exhibits no tenderness.  Lymphadenopathy:    He has no cervical adenopathy.  Neurological: He is alert.  Skin: No abrasion, no petechiae and no rash noted. Rash is not papular, not vesicular and not urticarial. No erythema. No pallor.  Psychiatric: He has a normal mood and affect.     Diagnostic studies: none     Malachi Bonds, MD  [facility]

## 2019-08-30 ENCOUNTER — Ambulatory Visit (INDEPENDENT_AMBULATORY_CARE_PROVIDER_SITE_OTHER): Payer: 59

## 2019-08-30 ENCOUNTER — Telehealth: Payer: Self-pay

## 2019-08-30 DIAGNOSIS — J309 Allergic rhinitis, unspecified: Secondary | ICD-10-CM

## 2019-08-30 NOTE — Telephone Encounter (Signed)
I am glad to hear that!  Thank you for keep me informed sir  Malachi Bonds, MD [facility]

## 2019-08-30 NOTE — Telephone Encounter (Signed)
Please read first message

## 2019-08-30 NOTE — Telephone Encounter (Signed)
Dr. Gloriann Loan, Patient came into the clinic and stated he was some what improving on his eye problem on the right side of the eye. Patient is still having swelling but irritation has calm down.

## 2019-09-08 ENCOUNTER — Ambulatory Visit (INDEPENDENT_AMBULATORY_CARE_PROVIDER_SITE_OTHER): Payer: 59

## 2019-09-08 DIAGNOSIS — J309 Allergic rhinitis, unspecified: Secondary | ICD-10-CM | POA: Diagnosis not present

## 2019-09-13 ENCOUNTER — Ambulatory Visit (INDEPENDENT_AMBULATORY_CARE_PROVIDER_SITE_OTHER): Payer: 59

## 2019-09-13 DIAGNOSIS — J309 Allergic rhinitis, unspecified: Secondary | ICD-10-CM | POA: Diagnosis not present

## 2019-09-27 ENCOUNTER — Ambulatory Visit (INDEPENDENT_AMBULATORY_CARE_PROVIDER_SITE_OTHER): Payer: 59

## 2019-09-27 DIAGNOSIS — J309 Allergic rhinitis, unspecified: Secondary | ICD-10-CM

## 2019-10-06 ENCOUNTER — Ambulatory Visit (INDEPENDENT_AMBULATORY_CARE_PROVIDER_SITE_OTHER): Payer: 59

## 2019-10-06 DIAGNOSIS — J309 Allergic rhinitis, unspecified: Secondary | ICD-10-CM | POA: Diagnosis not present

## 2019-10-13 ENCOUNTER — Ambulatory Visit (INDEPENDENT_AMBULATORY_CARE_PROVIDER_SITE_OTHER): Payer: 59

## 2019-10-13 DIAGNOSIS — J309 Allergic rhinitis, unspecified: Secondary | ICD-10-CM

## 2019-10-13 NOTE — Progress Notes (Signed)
Patient was made aware he would need a 2 vial set made. Patient verbalized understanding.

## 2019-10-16 DIAGNOSIS — J301 Allergic rhinitis due to pollen: Secondary | ICD-10-CM | POA: Diagnosis not present

## 2019-10-16 NOTE — Progress Notes (Signed)
VIALS EXP 10-15-20 

## 2019-10-17 DIAGNOSIS — J3081 Allergic rhinitis due to animal (cat) (dog) hair and dander: Secondary | ICD-10-CM | POA: Diagnosis not present

## 2019-10-25 ENCOUNTER — Ambulatory Visit (INDEPENDENT_AMBULATORY_CARE_PROVIDER_SITE_OTHER): Payer: 59

## 2019-10-25 DIAGNOSIS — J309 Allergic rhinitis, unspecified: Secondary | ICD-10-CM | POA: Diagnosis not present

## 2019-11-03 ENCOUNTER — Ambulatory Visit (INDEPENDENT_AMBULATORY_CARE_PROVIDER_SITE_OTHER): Payer: 59

## 2019-11-03 DIAGNOSIS — J309 Allergic rhinitis, unspecified: Secondary | ICD-10-CM | POA: Diagnosis not present

## 2019-11-17 ENCOUNTER — Ambulatory Visit (INDEPENDENT_AMBULATORY_CARE_PROVIDER_SITE_OTHER): Payer: 59

## 2019-11-17 DIAGNOSIS — J309 Allergic rhinitis, unspecified: Secondary | ICD-10-CM | POA: Diagnosis not present

## 2019-11-22 ENCOUNTER — Ambulatory Visit (INDEPENDENT_AMBULATORY_CARE_PROVIDER_SITE_OTHER): Payer: 59

## 2019-11-22 DIAGNOSIS — J309 Allergic rhinitis, unspecified: Secondary | ICD-10-CM

## 2019-11-29 ENCOUNTER — Ambulatory Visit (INDEPENDENT_AMBULATORY_CARE_PROVIDER_SITE_OTHER): Payer: 59

## 2019-11-29 DIAGNOSIS — J309 Allergic rhinitis, unspecified: Secondary | ICD-10-CM

## 2019-12-08 ENCOUNTER — Ambulatory Visit (INDEPENDENT_AMBULATORY_CARE_PROVIDER_SITE_OTHER): Payer: 59

## 2019-12-08 DIAGNOSIS — J309 Allergic rhinitis, unspecified: Secondary | ICD-10-CM | POA: Diagnosis not present

## 2019-12-20 ENCOUNTER — Ambulatory Visit (INDEPENDENT_AMBULATORY_CARE_PROVIDER_SITE_OTHER): Payer: 59

## 2019-12-20 DIAGNOSIS — J309 Allergic rhinitis, unspecified: Secondary | ICD-10-CM

## 2020-01-03 ENCOUNTER — Ambulatory Visit (INDEPENDENT_AMBULATORY_CARE_PROVIDER_SITE_OTHER): Payer: 59

## 2020-01-03 DIAGNOSIS — J309 Allergic rhinitis, unspecified: Secondary | ICD-10-CM

## 2020-01-19 ENCOUNTER — Telehealth: Payer: Self-pay | Admitting: Allergy & Immunology

## 2020-01-19 ENCOUNTER — Ambulatory Visit (INDEPENDENT_AMBULATORY_CARE_PROVIDER_SITE_OTHER): Payer: 59

## 2020-01-19 DIAGNOSIS — J309 Allergic rhinitis, unspecified: Secondary | ICD-10-CM

## 2020-01-19 NOTE — Telephone Encounter (Signed)
Patient called about his shot he received today. He received 0.15 cc. He stated he took his zyrtec before coming. He went home and after being home awhile, his throat started swelling. He spoke to Dr. Dellis Anes directly and Dr. Dellis Anes told him to take 2 benadryl. His wife is an Charity fundraiser and Dr. Dellis Anes told him to have her check for wheezing and watch him for 10 minutes and we would call back. I called him back with questions Dorathy Daft wanted me to ask.   I asked when the throat swelling started. It was 3:30 when he called, he stated the swelling started 2 hours before, so at  1:30.  What was he doing when this happened? He was putting away laundry.   What medication did he take? 2 benadryl Per Dr. Dellis Anes.   Did he use his epi pen? He stated no, not yet.

## 2020-01-19 NOTE — Telephone Encounter (Signed)
Patient called prior to taking the Benadryl.  He got his injection today and started experiencing some throat swelling prior to calling us.  He was able to speak in full sentences and did not seem short of breath over the phone.  His wife is at home and she is a Designer, jewellery.  He does have an EpiPen available next to him.  He has no hives, wheezing, stomach pain, or other symptoms.  While I would prefer that he take the EpiPen, I felt more comfortable telling him to take the Benadryl (which is what he preferred to do) knowing that his wife is a healthcare provider and is available for assessment.  We did call him back to minutes later and he was doing much better.  Reviewed his shot record.  He received 0.15 of his red vial of both of his shots.  I would like to go down to 0.05 mL of both vials next week and advance on schedule a from there.  Malachi Bonds, MD [facility]

## 2020-01-19 NOTE — Telephone Encounter (Signed)
Noted in patients follow sheet

## 2020-02-07 ENCOUNTER — Ambulatory Visit (INDEPENDENT_AMBULATORY_CARE_PROVIDER_SITE_OTHER): Payer: 59

## 2020-02-07 DIAGNOSIS — J309 Allergic rhinitis, unspecified: Secondary | ICD-10-CM | POA: Diagnosis not present

## 2020-02-16 ENCOUNTER — Ambulatory Visit (INDEPENDENT_AMBULATORY_CARE_PROVIDER_SITE_OTHER): Payer: 59

## 2020-02-16 DIAGNOSIS — J309 Allergic rhinitis, unspecified: Secondary | ICD-10-CM | POA: Diagnosis not present

## 2020-02-23 ENCOUNTER — Ambulatory Visit (INDEPENDENT_AMBULATORY_CARE_PROVIDER_SITE_OTHER): Payer: 59

## 2020-02-23 DIAGNOSIS — J309 Allergic rhinitis, unspecified: Secondary | ICD-10-CM

## 2020-03-08 ENCOUNTER — Other Ambulatory Visit: Payer: Self-pay | Admitting: Allergy

## 2020-03-08 ENCOUNTER — Ambulatory Visit (INDEPENDENT_AMBULATORY_CARE_PROVIDER_SITE_OTHER): Payer: 59

## 2020-03-08 DIAGNOSIS — J309 Allergic rhinitis, unspecified: Secondary | ICD-10-CM

## 2020-03-08 NOTE — Progress Notes (Signed)
Patient left without waiting, within 15 minutes patient called and stated he was having an allergic reaction. Eye swelling, throat tingling. Patients wife was on the way to bring him back to the office. Patient did take benadryl.

## 2020-03-15 ENCOUNTER — Ambulatory Visit (INDEPENDENT_AMBULATORY_CARE_PROVIDER_SITE_OTHER): Payer: 59

## 2020-03-15 ENCOUNTER — Other Ambulatory Visit: Payer: Self-pay

## 2020-03-15 DIAGNOSIS — J309 Allergic rhinitis, unspecified: Secondary | ICD-10-CM | POA: Diagnosis not present

## 2020-03-15 MED ORDER — EPINEPHRINE 0.3 MG/0.3ML IJ SOAJ: INTRAMUSCULAR | 1 refills | Status: DC

## 2020-03-22 ENCOUNTER — Ambulatory Visit (INDEPENDENT_AMBULATORY_CARE_PROVIDER_SITE_OTHER): Payer: 59

## 2020-03-22 DIAGNOSIS — J309 Allergic rhinitis, unspecified: Secondary | ICD-10-CM | POA: Diagnosis not present

## 2020-03-29 ENCOUNTER — Ambulatory Visit (INDEPENDENT_AMBULATORY_CARE_PROVIDER_SITE_OTHER): Payer: 59

## 2020-03-29 DIAGNOSIS — J309 Allergic rhinitis, unspecified: Secondary | ICD-10-CM

## 2020-04-05 ENCOUNTER — Ambulatory Visit (INDEPENDENT_AMBULATORY_CARE_PROVIDER_SITE_OTHER): Payer: Self-pay

## 2020-04-05 DIAGNOSIS — J309 Allergic rhinitis, unspecified: Secondary | ICD-10-CM

## 2020-05-01 ENCOUNTER — Ambulatory Visit (INDEPENDENT_AMBULATORY_CARE_PROVIDER_SITE_OTHER): Payer: 59

## 2020-05-01 DIAGNOSIS — J309 Allergic rhinitis, unspecified: Secondary | ICD-10-CM | POA: Diagnosis not present

## 2020-05-10 ENCOUNTER — Ambulatory Visit (INDEPENDENT_AMBULATORY_CARE_PROVIDER_SITE_OTHER): Payer: 59

## 2020-05-10 DIAGNOSIS — J309 Allergic rhinitis, unspecified: Secondary | ICD-10-CM | POA: Diagnosis not present

## 2020-05-17 ENCOUNTER — Ambulatory Visit (INDEPENDENT_AMBULATORY_CARE_PROVIDER_SITE_OTHER): Payer: 59

## 2020-05-17 DIAGNOSIS — J309 Allergic rhinitis, unspecified: Secondary | ICD-10-CM

## 2020-06-14 ENCOUNTER — Ambulatory Visit (INDEPENDENT_AMBULATORY_CARE_PROVIDER_SITE_OTHER): Payer: 59

## 2020-06-14 DIAGNOSIS — J309 Allergic rhinitis, unspecified: Secondary | ICD-10-CM

## 2020-06-19 ENCOUNTER — Ambulatory Visit (INDEPENDENT_AMBULATORY_CARE_PROVIDER_SITE_OTHER): Payer: 59

## 2020-06-19 DIAGNOSIS — J309 Allergic rhinitis, unspecified: Secondary | ICD-10-CM

## 2020-08-02 ENCOUNTER — Ambulatory Visit (INDEPENDENT_AMBULATORY_CARE_PROVIDER_SITE_OTHER): Payer: 59

## 2020-08-02 DIAGNOSIS — J309 Allergic rhinitis, unspecified: Secondary | ICD-10-CM

## 2020-09-09 DIAGNOSIS — J302 Other seasonal allergic rhinitis: Secondary | ICD-10-CM

## 2020-09-09 NOTE — Progress Notes (Signed)
VIALS EXP 09-09-21 

## 2020-09-12 DIAGNOSIS — J3081 Allergic rhinitis due to animal (cat) (dog) hair and dander: Secondary | ICD-10-CM

## 2020-09-12 NOTE — Progress Notes (Signed)
LABEL FOR BILLING.

## 2020-09-13 ENCOUNTER — Telehealth: Payer: Self-pay | Admitting: Allergy & Immunology

## 2020-09-13 NOTE — Telephone Encounter (Signed)
Called pt to schedule OV for insurance purposes regarding allergy injections. Did not answer. Voicemail box was full.

## 2020-09-18 ENCOUNTER — Ambulatory Visit (INDEPENDENT_AMBULATORY_CARE_PROVIDER_SITE_OTHER): Payer: 59

## 2020-09-18 DIAGNOSIS — J309 Allergic rhinitis, unspecified: Secondary | ICD-10-CM | POA: Diagnosis not present

## 2020-09-24 NOTE — Patient Instructions (Addendum)
Moderate persistent asthma During asthma flares/ upper respiratory tract infections start Qvar 40 mcg 2 puffs twice a day with spacer until symptoms return to baseline Start Singulair 10 mg once a day to help prevent cough and wheeze Patient cautioned that rarely some children/adults can experience behavioral changes after beginning montelukast. These side effects are rare, however, if you notice any change, notify the clinic and discontinue montelukast. May use albuterol 2 puffs every 4 hours as needed for cough, wheeze, tightness in chest, shortness of breath. Asthma control goals:   Full participation in all desired activities (may need albuterol before activity)  Albuterol use two time or less a week on average (not counting use with activity)  Cough interfering with sleep two time or less a month  Oral steroids no more than once a year  No hospitalizations  Seasonal and perennial allergic rhinitis May use an over-the-counter antihistamine such as Claritin, Zyrtec, Xyzal, or Allegra once a day as needed for runny nose or itching Continue Singulair 10 mg as above Start Dymista 1 spray each nostril twice a day needed for runny nose/stuffy nose Continue allergy injections and have access to epinephrine autoinjector  Allergic conjunctivitis Continue Pazeo 1 drop each eye once a day as needed for itchy watery eyes  Keep already scheduled eye appointemnt in April with your eye doctor  Please let us know if this treatment plan is not working well for you Schedule a follow-up appointment in 2 months

## 2020-09-25 ENCOUNTER — Telehealth: Payer: Self-pay

## 2020-09-25 ENCOUNTER — Other Ambulatory Visit: Payer: Self-pay

## 2020-09-25 ENCOUNTER — Encounter: Payer: Self-pay | Admitting: Family

## 2020-09-25 ENCOUNTER — Ambulatory Visit (INDEPENDENT_AMBULATORY_CARE_PROVIDER_SITE_OTHER): Payer: 59 | Admitting: Family

## 2020-09-25 VITALS — BP 124/74 | HR 78 | Temp 99.4°F | Resp 18 | Ht 70.0 in | Wt 277.0 lb

## 2020-09-25 DIAGNOSIS — J309 Allergic rhinitis, unspecified: Secondary | ICD-10-CM | POA: Diagnosis not present

## 2020-09-25 DIAGNOSIS — J4540 Moderate persistent asthma, uncomplicated: Secondary | ICD-10-CM

## 2020-09-25 DIAGNOSIS — J302 Other seasonal allergic rhinitis: Secondary | ICD-10-CM | POA: Diagnosis not present

## 2020-09-25 DIAGNOSIS — J3089 Other allergic rhinitis: Secondary | ICD-10-CM | POA: Diagnosis not present

## 2020-09-25 MED ORDER — AZELASTINE-FLUTICASONE 137-50 MCG/ACT NA SUSP: NASAL | 5 refills | Status: DC

## 2020-09-25 MED ORDER — EPINEPHRINE 0.3 MG/0.3ML IJ SOAJ: INTRAMUSCULAR | 1 refills | Status: AC

## 2020-09-25 MED ORDER — QVAR REDIHALER 40 MCG/ACT IN AERB: 2.0000 | INHALATION_SPRAY | Freq: Two times a day (BID) | RESPIRATORY_TRACT | 3 refills | Status: DC

## 2020-09-25 MED ORDER — MONTELUKAST SODIUM 10 MG PO TABS: 10.0000 mg | ORAL_TABLET | Freq: Every day | ORAL | 5 refills | Status: DC

## 2020-09-25 MED ORDER — OLOPATADINE HCL 0.1 % OP SOLN: 1.0000 [drp] | Freq: Two times a day (BID) | OPHTHALMIC | 12 refills | Status: DC | PRN

## 2020-09-25 MED ORDER — ALBUTEROL SULFATE HFA 108 (90 BASE) MCG/ACT IN AERS: 2.0000 | INHALATION_SPRAY | RESPIRATORY_TRACT | 0 refills | Status: DC | PRN

## 2020-09-25 NOTE — Telephone Encounter (Signed)
Prior auth for azelastine-fluticasone and Qvar Redihaler submitted on covermymeds. They are currently pending.

## 2020-09-25 NOTE — Progress Notes (Signed)
[address] [place] [address] Dept: [phone]  FOLLOW UP NOTE  Patient ID: Frank Gilbert, male    DOB: Nov 10, 1985  Age: 36 y.o. MRN: [id number] Date of Office Visit: 09/25/2020  Assessment  Chief Complaint: Allergic Rhinitis   HPI Frank Gilbert is a 35 year old male who presents today for follow-up of moderate persistent asthma, seasonal and perennial allergic rhinitis and bacterial conjunctivitis of the right eye.  He was last seen on August 18, 2019 by Dr. Dellis Anes.  Moderate persistent asthma is reported as controlled with no medications.  He has not been taking Qvar 40 mcg 2 puffs twice a day, Spiriva 1.25 mcg, or Singulair 10 mg once a day for several months, maybe even close to a year.  He is also out of his albuterol inhaler and has not used his albuterol inhaler in a long time.  He reports occasional coughing in the morning due to postnasal drip and occasional coughing fits.  He denies any wheezing, tightness in his chest, shortness of breath, and nocturnal awakenings.  Since his last office visit he has not required any systemic steroids or made any trips to the emergency room or urgent care due to breathing problems.  Seasonal and perennial allergic rhinitis is reported as moderately controlled with Zyrtec 10 mg once a day.  He is out of Singulair 10 mg once a day, Dymista 1 spray each nostril twice a day, and Pazeo 1 drop each eye once a day.  He reports postnasal drip in the morning when he first gets up in the rest of the day this is not an issure.  He denies any rhinorrhea and nasal congestion.  He has not had any sinus infections since we last saw him. His allergies usually will cat up when he starts mowing.  He reports for the past year or year and a half he has had problems with his right eye swelling at times and having "goop".  This will last for a couple weeks then it will go away for period of time. He wonders if he has an eyelash that  bothers his right eye.  He does have an eye appointment with his eye doctor in April.  He is currently not having any swelling or "goop" in his right eye currently.   Drug Allergies:  Allergies  Allergen Reactions  . Oxycodone Itching and Rash  . Penicillins Rash    Review of Systems: Review of Systems  Constitutional: Negative for chills and fever.  HENT:       Nasal drip in the morning and denies rhinorrhea and nasal congestion  Eyes:       Denies Itchy watery eyes  Respiratory: Positive for cough. Negative for shortness of breath and wheezing.   Cardiovascular: Negative for chest pain and palpitations.  Gastrointestinal: Positive for heartburn. Negative for abdominal pain.  Genitourinary: Negative for dysuria.  Skin: Negative for itching and rash.  Neurological: Negative for headaches.  Endo/Heme/Allergies: Positive for environmental allergies.    Physical Exam: BP 124/74 (BP Location: Left Arm, Patient Position: Sitting, Cuff Size: Large)   Pulse 78   Temp 99.4 F (37.4 C) (Temporal)   Resp 18   Ht 5\' 10"  (1.778 m)   Wt 277 lb (125.6 kg)   SpO2 97%   BMI 39.75 kg/m    Physical Exam Constitutional:      Appearance: Normal appearance.  HENT:     Head: Normocephalic and atraumatic.     Comments:  Normal, eyes normal, ears normal, nose: Bilateral lower turbinate mildly edematous and slightly erythematous with no drainage noted    Right Ear: Tympanic membrane, ear canal and external ear normal.     Left Ear: Tympanic membrane, ear canal and external ear normal.     Mouth/Throat:     Mouth: Mucous membranes are moist.     Pharynx: Oropharynx is clear.  Eyes:     Conjunctiva/sclera: Conjunctivae normal.  Cardiovascular:     Rate and Rhythm: Regular rhythm.     Heart sounds: Normal heart sounds.  Pulmonary:     Effort: Pulmonary effort is normal.     Breath sounds: Normal breath sounds.     Comments: Lungs clear to auscultation Musculoskeletal:     Cervical back:  Neck supple.  Skin:    General: Skin is warm.  Neurological:     Mental Status: He is alert and oriented to person, place, and time.  Psychiatric:        Mood and Affect: Mood normal.        Behavior: Behavior normal.        Thought Content: Thought content normal.        Judgment: Judgment normal.     Diagnostics: FVC 3.90 L, FEV1 3.50 L.  Predicted FVC 5.39 L, FEV1 4.33 L.  Spirometry indicates mild restriction. Post bronchodilator response shows FVC 3.94 L, FEV1 3.54 L. Spirometry indicates mild restriction with no significant bronchodilator response.  Assessment and Plan: 1. Moderate persistent asthma without complication   2. Seasonal and perennial allergic rhinitis     Meds ordered this encounter  Medications  . albuterol (PROAIR HFA) 108 (90 Base) MCG/ACT inhaler    Sig: Inhale 2 puffs into the lungs every 4 (four) hours as needed.    Dispense:  1 each    Refill:  0  . Azelastine-Fluticasone (DYMISTA) 137-50 MCG/ACT SUSP    Sig: Use 1 spray in each nostril twice a day as needed for runny/stuffy nose    Dispense:  23 g    Refill:  5  . EPINEPHrine 0.3 mg/0.3 mL IJ SOAJ injection    Sig: TAKE AS DIRECTED PER PACKAGE INSTRUCTIONS.    Dispense:  2 each    Refill:  1  . olopatadine (PATANOL) 0.1 % ophthalmic solution    Sig: Place 1 drop into both eyes 2 (two) times daily as needed for allergies.    Dispense:  5 mL    Refill:  12  . montelukast (SINGULAIR) 10 MG tablet    Sig: Take 1 tablet (10 mg total) by mouth at bedtime.    Dispense:  30 tablet    Refill:  5  . beclomethasone (QVAR REDIHALER) 40 MCG/ACT inhaler    Sig: Inhale 2 puffs into the lungs 2 (two) times daily.    Dispense:  1 each    Refill:  3    Patient Instructions  Moderate persistent asthma During asthma flares/ upper respiratory tract infections start Qvar 40 mcg 2 puffs twice a day with spacer until symptoms return to baseline Start Singulair 10 mg once a day to help prevent cough and  wheeze Patient cautioned that rarely some children/adults can experience behavioral changes after beginning montelukast. These side effects are rare, however, if you notice any change, notify the clinic and discontinue montelukast. May use albuterol 2 puffs every 4 hours as needed for cough, wheeze, tightness in chest, shortness of breath. Asthma control goals:   Full participation in all  desired activities (may need albuterol before activity)  Albuterol use two time or less a week on average (not counting use with activity)  Cough interfering with sleep two time or less a month  Oral steroids no more than once a year  No hospitalizations  Seasonal and perennial allergic rhinitis May use an over-the-counter antihistamine such as Claritin, Zyrtec, Xyzal, or Allegra once a day as needed for runny nose or itching Continue Singulair 10 mg as above Start Dymista 1 spray each nostril twice a day needed for runny nose/stuffy nose Continue allergy injections and have access to epinephrine autoinjector  Allergic conjunctivitis Continue Pazeo 1 drop each eye once a day as needed for itchy watery eyes  Keep already scheduled eye appointemnt in April with your eye doctor  Please let us know if this treatment plan is not working well for you Schedule a follow-up appointment in 2 months   Return in about 2 months (around 11/25/2020), or if symptoms worsen or fail to improve.    Thank you for the opportunity to care for this patient.  Please do not hesitate to contact me with questions.  Nehemiah Settle, FNP [facility]

## 2020-09-27 MED ORDER — FLOVENT HFA 44 MCG/ACT IN AERO: INHALATION_SPRAY | RESPIRATORY_TRACT | 5 refills | Status: DC

## 2020-09-27 MED ORDER — AZELASTINE HCL 0.1 % NA SOLN: NASAL | 5 refills | Status: DC

## 2020-09-27 MED ORDER — FLUTICASONE PROPIONATE 50 MCG/ACT NA SUSP: NASAL | 5 refills | Status: DC

## 2020-09-27 NOTE — Telephone Encounter (Signed)
Please send in a prescription for fluticasone 1-2 sprays each nostril once a day as needed for stuffy nose, azelastine nasal spray 1 spray each nostril twice a day as needed for runny nose/ drainage, and  with asthma flares/ upper respiratory tract infections start Flovent 44 mcg 2 puffs twice a day with spacer until symptoms return to baseline.   Thank you!

## 2020-09-27 NOTE — Addendum Note (Signed)
Addended by: Mliss Fritz I on: 09/27/2020 12:20 PM   Modules accepted: Orders

## 2020-09-27 NOTE — Telephone Encounter (Signed)
New prescriptions have been sent in and patient has been informed.

## 2020-09-27 NOTE — Telephone Encounter (Signed)
Azelastine-Fluticasone and Qvar Redihaler both were denied. Alternatives for Qvar include Arnuity Ellipta, Flovent HFA, Flovent Diskus, and Pulmicort Flexhaler. For the Azelastine-Fluticasone do you want to split it and send Azelastine and Fluticasone. Please advise.

## 2020-10-04 ENCOUNTER — Ambulatory Visit (INDEPENDENT_AMBULATORY_CARE_PROVIDER_SITE_OTHER): Payer: 59

## 2020-10-04 DIAGNOSIS — J309 Allergic rhinitis, unspecified: Secondary | ICD-10-CM | POA: Diagnosis not present

## 2020-10-11 ENCOUNTER — Ambulatory Visit (INDEPENDENT_AMBULATORY_CARE_PROVIDER_SITE_OTHER): Payer: 59

## 2020-10-11 DIAGNOSIS — J309 Allergic rhinitis, unspecified: Secondary | ICD-10-CM

## 2020-10-25 ENCOUNTER — Ambulatory Visit (INDEPENDENT_AMBULATORY_CARE_PROVIDER_SITE_OTHER): Payer: 59

## 2020-10-25 ENCOUNTER — Ambulatory Visit: Payer: Self-pay

## 2020-10-25 DIAGNOSIS — J309 Allergic rhinitis, unspecified: Secondary | ICD-10-CM | POA: Diagnosis not present

## 2020-11-01 ENCOUNTER — Ambulatory Visit (INDEPENDENT_AMBULATORY_CARE_PROVIDER_SITE_OTHER): Payer: 59

## 2020-11-01 DIAGNOSIS — J309 Allergic rhinitis, unspecified: Secondary | ICD-10-CM

## 2020-11-27 ENCOUNTER — Telehealth: Payer: Self-pay

## 2020-11-27 ENCOUNTER — Ambulatory Visit: Payer: 59 | Admitting: Allergy & Immunology

## 2020-11-27 NOTE — Telephone Encounter (Signed)
Called and spoke to patient and informed him of his missed appointment. patient expressed that he was the hospital with his daughter. Patient rescheduled his appointment with Frank Gilbert later this month.

## 2020-12-09 NOTE — Patient Instructions (Addendum)
Moderate persistent asthma During asthma flares/ upper respiratory tract infections start Flovent 44 mcg 2 puffs twice a day with spacer until symptoms return to baseline Continue Singulair 10 mg once a day to help prevent cough and wheeze May use albuterol 2 puffs every 4 hours as needed for cough, wheeze, tightness in chest, shortness of breath. Asthma control goals:   Full participation in all desired activities (may need albuterol before activity)  Albuterol use two time or less a week on average (not counting use with activity)  Cough interfering with sleep two time or less a month  Oral steroids no more than once a year  No hospitalizations  Seasonal and perennial allergic rhinitis May use an over-the-counter antihistamine such as Claritin, Zyrtec, Xyzal, or Allegra once a day as needed for runny nose or itching Continue Singulair 10 mg as above Contine fluticasone 1-2 sprays each nostril once a day needed forstuffy nose Continue azelastine nasal spray using 1 to 2 sprays each nostril twice a day as needed for runny nose/drainage down throat Continue allergy injections and have access to epinephrine autoinjector  Allergic conjunctivitis Continue Pazeo 1 drop each eye once a day as needed for itchy watery eyes  Please let us know if this treatment plan is not working well for you Schedule a follow-up appointment in 5 months or sooner if needed

## 2020-12-11 ENCOUNTER — Ambulatory Visit (INDEPENDENT_AMBULATORY_CARE_PROVIDER_SITE_OTHER): Payer: 59 | Admitting: Family

## 2020-12-11 ENCOUNTER — Other Ambulatory Visit: Payer: Self-pay

## 2020-12-11 ENCOUNTER — Encounter: Payer: Self-pay | Admitting: Family

## 2020-12-11 VITALS — BP 126/82 | HR 69 | Temp 97.3°F | Resp 18 | Ht 70.0 in | Wt 288.0 lb

## 2020-12-11 DIAGNOSIS — J302 Other seasonal allergic rhinitis: Secondary | ICD-10-CM | POA: Diagnosis not present

## 2020-12-11 DIAGNOSIS — J4540 Moderate persistent asthma, uncomplicated: Secondary | ICD-10-CM

## 2020-12-11 DIAGNOSIS — J309 Allergic rhinitis, unspecified: Secondary | ICD-10-CM | POA: Diagnosis not present

## 2020-12-11 DIAGNOSIS — J3089 Other allergic rhinitis: Secondary | ICD-10-CM | POA: Diagnosis not present

## 2020-12-11 MED ORDER — MONTELUKAST SODIUM 10 MG PO TABS: 10.0000 mg | ORAL_TABLET | Freq: Every day | ORAL | 5 refills | Status: DC

## 2020-12-11 MED ORDER — ALBUTEROL SULFATE HFA 108 (90 BASE) MCG/ACT IN AERS: 2.0000 | INHALATION_SPRAY | RESPIRATORY_TRACT | 1 refills | Status: DC | PRN

## 2020-12-11 MED ORDER — QVAR REDIHALER 40 MCG/ACT IN AERB: 2.0000 | INHALATION_SPRAY | Freq: Two times a day (BID) | RESPIRATORY_TRACT | 5 refills | Status: DC

## 2020-12-11 MED ORDER — FLUTICASONE PROPIONATE 50 MCG/ACT NA SUSP: NASAL | 5 refills | Status: DC

## 2020-12-11 MED ORDER — CETIRIZINE HCL 10 MG PO TABS: ORAL_TABLET | ORAL | 5 refills | Status: DC

## 2020-12-11 MED ORDER — AZELASTINE HCL 0.1 % NA SOLN: NASAL | 5 refills | Status: DC

## 2020-12-11 MED ORDER — AZELASTINE-FLUTICASONE 137-50 MCG/ACT NA SUSP: NASAL | 5 refills | Status: AC

## 2020-12-11 MED ORDER — FLOVENT HFA 44 MCG/ACT IN AERO: INHALATION_SPRAY | RESPIRATORY_TRACT | 5 refills | Status: DC

## 2020-12-11 MED ORDER — OLOPATADINE HCL 0.1 % OP SOLN: 1.0000 [drp] | Freq: Two times a day (BID) | OPHTHALMIC | 12 refills | Status: DC | PRN

## 2020-12-11 NOTE — Addendum Note (Signed)
Addended by: Grier Rocher on: 12/11/2020 11:40 AM   Modules accepted: Orders

## 2020-12-11 NOTE — Progress Notes (Signed)
[address] [place] [address] Dept: [phone]  FOLLOW UP NOTE  Patient ID: Frank Gilbert, male    DOB: May 11, 1986  Age: 35 y.o. MRN: [id number] Date of Office Visit: 12/11/2020  Assessment  Chief Complaint: No chief complaint on file.  HPI Frank Gilbert is a 35 year old male presents today for follow-up of moderate persistent asthma without complication and seasonal and perennial allergic rhinitis.  He was last seen on September 25, 2020 by Nehemiah Settle, FNP.  Moderate persistent asthma is reported as controlled with Singulair 10 mg once a day and albuterol as needed.  He reports that the Qvar 40 mcg for asthma flares that we prescribed at the last office visit was denied by his insurance.  Instructed him that Flovent 44 mcg was sent in its place and to check and see if he has this at home or if it is still at the pharmacy.  Instructed him to call us if he does not have this.  He denies any coughing, wheezing, tightness in his chest, shortness of breath, and nocturnal awakenings.  Since his last office visit he has not required any systemic steroids or made any trips to the emergency room or urgent care due to breathing problems.  He has not had to use his albuterol inhaler any since we last saw him.  His ACT score today is a 22.  Seasonal and perennial allergic rhinitis is reported as moderately controlled with Singulair 10 mg once a day and allergy injections.  He has not needed to use fluticasone or azelastine nasal spray.  He is currently also not taking an antihistamine.  He reports that he has missed several allergy injections due to health issues of his children.  He reports in the early beginning of spring he was having nasal congestion but now he denies any rhinorrhea, nasal congestion, and postnasal drip.  He has not had any sinus infections since we last saw him.   Drug Allergies:  Allergies  Allergen Reactions  . Oxycodone Itching and Rash  .  Penicillins Rash    Review of Systems: Review of Systems  Constitutional: Negative for chills and fever.  HENT:       Denies rhinorrhea, nasal congestion, and postnasal drip  Eyes:       Denies itchy watery eyes  Respiratory: Negative for cough, shortness of breath and wheezing.   Cardiovascular: Negative for chest pain and palpitations.  Gastrointestinal:       Reports reflux but does not wish to be started on any medication at this time  Genitourinary: Negative for dysuria.  Skin: Negative for itching and rash.  Neurological: Negative for headaches.  Endo/Heme/Allergies: Positive for environmental allergies.    Physical Exam: BP 126/82 (BP Location: Right Arm, Patient Position: Sitting, Cuff Size: Large)   Pulse 69   Temp (!) 97.3 F (36.3 C) (Temporal)   Resp 18   Ht 5\' 10"  (1.778 m)   Wt 288 lb (130.6 kg)   SpO2 97%   BMI 41.32 kg/m    Physical Exam Constitutional:      Appearance: Normal appearance.  HENT:     Head: Normocephalic and atraumatic.     Comments: Pharynx normal, eyes normal, ears normal, nose: Bilateral lower turbinates mildly edematous with clear drainage noted    Right Ear: Tympanic membrane, ear canal and external ear normal.     Left Ear: Tympanic membrane, ear canal and external ear normal.     Mouth/Throat:  Mouth: Mucous membranes are moist.     Pharynx: Oropharynx is clear.  Eyes:     Conjunctiva/sclera: Conjunctivae normal.  Cardiovascular:     Rate and Rhythm: Regular rhythm.     Heart sounds: Normal heart sounds.  Pulmonary:     Effort: Pulmonary effort is normal.     Breath sounds: Normal breath sounds.     Comments: Lungs clear to auscultation Musculoskeletal:     Cervical back: Neck supple.  Skin:    General: Skin is warm.  Neurological:     Mental Status: He is alert and oriented to person, place, and time.  Psychiatric:        Mood and Affect: Mood normal.        Behavior: Behavior normal.        Thought Content:  Thought content normal.        Judgment: Judgment normal.     Diagnostics: FVC 2.99 L, FEV1 2.52 L.  Predicted FVC 5.32 L, FEV1 4.32 L.  Spirometry indicates severe moderate restriction.  Assessment and Plan: 1. Moderate persistent asthma without complication   2. Seasonal and perennial allergic rhinitis     No orders of the defined types were placed in this encounter.   Patient Instructions  Moderate persistent asthma During asthma flares/ upper respiratory tract infections start Flovent 44 mcg 2 puffs twice a day with spacer until symptoms return to baseline Continue Singulair 10 mg once a day to help prevent cough and wheeze May use albuterol 2 puffs every 4 hours as needed for cough, wheeze, tightness in chest, shortness of breath. Asthma control goals:   Full participation in all desired activities (may need albuterol before activity)  Albuterol use two time or less a week on average (not counting use with activity)  Cough interfering with sleep two time or less a month  Oral steroids no more than once a year  No hospitalizations  Seasonal and perennial allergic rhinitis May use an over-the-counter antihistamine such as Claritin, Zyrtec, Xyzal, or Allegra once a day as needed for runny nose or itching Continue Singulair 10 mg as above Contine fluticasone 1-2 sprays each nostril once a day needed forstuffy nose Continue azelastine nasal spray using 1 to 2 sprays each nostril twice a day as needed for runny nose/drainage down throat Continue allergy injections and have access to epinephrine autoinjector  Allergic conjunctivitis Continue Pazeo 1 drop each eye once a day as needed for itchy watery eyes  Please let us know if this treatment plan is not working well for you Schedule a follow-up appointment in 5 months or sooner if needed   Return in about 5 months (around 05/13/2021).    Thank you for the opportunity to care for this patient.  Please do not hesitate  to contact me with questions.  Nehemiah Settle, FNP [facility]

## 2020-12-11 NOTE — Addendum Note (Signed)
Addended by: Dollene Cleveland R on: 12/11/2020 12:15 PM   Modules accepted: Orders

## 2021-01-13 ENCOUNTER — Other Ambulatory Visit: Payer: Self-pay

## 2021-01-13 MED ORDER — FLUTICASONE PROPIONATE HFA 44 MCG/ACT IN AERO: INHALATION_SPRAY | RESPIRATORY_TRACT | 5 refills | Status: AC

## 2021-01-13 MED ORDER — FLUTICASONE PROPIONATE 50 MCG/ACT NA SUSP
NASAL | 5 refills | Status: AC
Start: 2021-01-13 — End: ?

## 2021-01-13 MED ORDER — AZELASTINE HCL 0.1 % NA SOLN: NASAL | 5 refills | Status: AC

## 2021-01-13 MED ORDER — CETIRIZINE HCL 10 MG PO TABS: ORAL_TABLET | ORAL | 5 refills | Status: AC

## 2021-01-13 MED ORDER — ALBUTEROL SULFATE HFA 108 (90 BASE) MCG/ACT IN AERS: 2.0000 | INHALATION_SPRAY | RESPIRATORY_TRACT | 1 refills | Status: AC | PRN

## 2021-01-13 MED ORDER — MONTELUKAST SODIUM 10 MG PO TABS: 10.0000 mg | ORAL_TABLET | Freq: Every day | ORAL | 5 refills | Status: AC

## 2021-01-13 MED ORDER — QVAR REDIHALER 40 MCG/ACT IN AERB: 2.0000 | INHALATION_SPRAY | Freq: Two times a day (BID) | RESPIRATORY_TRACT | 5 refills | Status: AC

## 2021-01-13 MED ORDER — OLOPATADINE HCL 0.1 % OP SOLN: 1.0000 [drp] | Freq: Two times a day (BID) | OPHTHALMIC | 5 refills | Status: AC | PRN

## 2021-01-13 NOTE — Telephone Encounter (Signed)
Patient called because medications have been sent to the wrong pharmacy. They no longer use the [facility] on [address] and only [facility]. I resent the medications to [facility]. Patient was las seen 12/11/20 and is due back 05/13/21 for an Office visit.

## 2021-01-18 NOTE — Telephone Encounter (Signed)
I denied Qvar due to the patient's insurance not covering.

## 2021-01-20 NOTE — Telephone Encounter (Signed)
No worries :)

## 2021-01-20 NOTE — Telephone Encounter (Signed)
It was Qvar that was not covered and was changed to Flovent.

## 2021-01-20 NOTE — Telephone Encounter (Signed)
Would you like me to send in a regular epi-pen as a replacement to the [facility]

## 2021-01-20 NOTE — Telephone Encounter (Signed)
My apologies disregard previous note.

## 2021-05-14 ENCOUNTER — Encounter: Payer: Self-pay | Admitting: Allergy & Immunology

## 2021-05-14 ENCOUNTER — Other Ambulatory Visit: Payer: Self-pay

## 2021-05-14 ENCOUNTER — Ambulatory Visit (INDEPENDENT_AMBULATORY_CARE_PROVIDER_SITE_OTHER): Payer: 59 | Admitting: Allergy & Immunology

## 2021-05-14 VITALS — BP 126/88 | HR 74 | Temp 97.7°F | Resp 16 | Ht 69.0 in | Wt 290.0 lb

## 2021-05-14 DIAGNOSIS — J3089 Other allergic rhinitis: Secondary | ICD-10-CM

## 2021-05-14 DIAGNOSIS — J4540 Moderate persistent asthma, uncomplicated: Secondary | ICD-10-CM

## 2021-05-14 DIAGNOSIS — J309 Allergic rhinitis, unspecified: Secondary | ICD-10-CM

## 2021-05-14 DIAGNOSIS — J302 Other seasonal allergic rhinitis: Secondary | ICD-10-CM | POA: Diagnosis not present

## 2021-05-14 DIAGNOSIS — R0683 Snoring: Secondary | ICD-10-CM | POA: Diagnosis not present

## 2021-05-14 NOTE — Progress Notes (Signed)
FOLLOW UP  Date of Service/Encounter:  05/14/21   Assessment:   Moderate persistent asthma without complication - poor perceiver?   Seasonal and perennial allergic rhinitis - wanting to restart allergen immunotherapy  Plan/Recommendations:    Moderate persistent asthma - Lung testing not done today. - We are not going to make any changes at all.  - Add on the Flovent during periods of flares/increased respiratory infections for 1-2 weeks.  - May use albuterol 2 puffs every 4 hours as needed for cough, wheeze, tightness in chest, shortness of breath. Asthma control goals:  Full participation in all desired activities (may need albuterol before activity) Albuterol use two time or less a week on average (not counting use with activity) Cough interfering with sleep two time or less a month Oral steroids no more than once a year No hospitalizations  2. Seasonal and perennial allergic rhinitis - Continue with the as needed use of an antihistamine. - Restart the Singulair 10 mg as above - Continue with the Pazeo eye drops as needed.  - We could restart the nose sprays if things get really bad, but we are not going to worry about it. - We will restart your allergy shots at the [facility] 0.1 mL and advance from there.   3. Return in about 6 months (around 11/12/2021).    Subjective:   Frank Gilbert is a 35 y.o. male presenting today for follow up of  Chief Complaint  Patient presents with   Asthma   Allergic Rhinitis     6 mth follow up    Frank Gilbert has a history of the following: Patient Active Problem List   Diagnosis Date Noted   Snoring 05/14/2021   Seasonal and perennial allergic rhinitis 11/21/2018   Heartburn 11/21/2018   Moderate persistent asthma without complication 11/21/2018   Allergic conjunctivitis of both eyes 11/21/2018    History obtained from: chart review and patient.  Frank Gilbert is a 35 y.o. male presenting for a follow up  visit.  He was last seen in May 2022 by Frank Gilbert, one of our esteemed nurse practitioners.  At that time, he was continued on Flovent 44 mcg 2 puffs twice daily during flares as well as Singulair 10 mg daily.  For his allergic rhinitis, he was continued on an over-the-counter antihistamine as well as the Singulair and Flonase with Astelin as needed.  He was continued on allergen immunotherapy.  Since the last visit he has done well. Her daughter was placed on MTX and Humira for her RA. This has been monopolizing a lot of his time. This is why he has not been in for allergy shots recently. However, symptoms have worsened and he wants to restart the allergy shots again.   Asthma/Respiratory Symptom History: He is not using his inhalers on a routine basis. He has not picked it up to use it in over one year. He feels that the  medications that he picked up last time were a "waste of money" since he never uses them. He denies any nighttime symptoms including coughing or wheezing. He does snore at night. He does not have limitations of his physical activity due to his symptoms. Allergy shots were helping with the asthma symptoms.   Allergic Rhinitis Symptom History: He does not use the nose sprays since they all seem to make him sneeze more. He is using the Singulair and an OTC antihistamine. Symptoms have been particularly bad this time of the year without  his allergy shots on board.   He does have a diagnosis of OSA and has a CPAP, but he never uses it. He tells me that it just wakes up him up more.   Otherwise, there have been no changes to his past medical history, surgical history, family history, or social history.    Review of Systems  Constitutional: Negative.  Negative for chills, fever, malaise/fatigue and weight loss.  HENT:  Positive for congestion and sinus pain. Negative for ear discharge and ear pain.        Positive for sneezing. Positive for snoring.  Eyes:  Negative for pain,  discharge and redness.  Respiratory:  Negative for cough, sputum production, shortness of breath and wheezing.   Cardiovascular: Negative.  Negative for chest pain and palpitations.  Gastrointestinal:  Negative for abdominal pain, constipation, diarrhea, heartburn, nausea and vomiting.  Skin: Negative.  Negative for itching and rash.  Neurological:  Negative for dizziness and headaches.  Endo/Heme/Allergies:  Positive for environmental allergies. Does not bruise/bleed easily.      Objective:   Blood pressure 126/88, pulse 74, temperature 97.7 F (36.5 C), resp. rate 16, height 5\' 9"  (1.753 m), weight 290 lb (131.5 kg), SpO2 96 %. Body mass index is 42.83 kg/m.   Physical Exam:  Physical Exam Vitals reviewed.  Constitutional:      Appearance: He is well-developed.  HENT:     Head: Normocephalic and atraumatic.     Right Ear: Tympanic membrane, ear canal and external ear normal.     Left Ear: Tympanic membrane, ear canal and external ear normal.     Nose: Mucosal edema and rhinorrhea present. No nasal deformity, septal deviation or laceration.     Right Turbinates: Enlarged, swollen and pale.     Left Turbinates: Enlarged, swollen and pale.     Right Sinus: No maxillary sinus tenderness or frontal sinus tenderness.     Left Sinus: No maxillary sinus tenderness or frontal sinus tenderness.     Mouth/Throat:     Mouth: Mucous membranes are not pale and not dry.     Pharynx: Uvula midline.  Eyes:     General: Lids are normal. No allergic shiner.       Right eye: No discharge.        Left eye: No discharge.     Conjunctiva/sclera: Conjunctivae normal.     Right eye: Right conjunctiva is not injected. No chemosis.    Left eye: Left conjunctiva is not injected. No chemosis.    Pupils: Pupils are equal, round, and reactive to light.  Cardiovascular:     Rate and Rhythm: Normal rate and regular rhythm.     Heart sounds: Normal heart sounds.  Pulmonary:     Effort: Pulmonary  effort is normal. No tachypnea, accessory muscle usage or respiratory distress.     Breath sounds: Normal breath sounds. No wheezing, rhonchi or rales.  Chest:     Chest wall: No tenderness.  Lymphadenopathy:     Cervical: No cervical adenopathy.  Skin:    Coloration: Skin is not pale.     Findings: No abrasion, erythema, petechiae or rash. Rash is not papular, urticarial or vesicular.  Neurological:     Mental Status: He is alert.  Psychiatric:        Behavior: Behavior is cooperative.     Diagnostic studies: none       , MD  [facility]

## 2021-05-14 NOTE — Patient Instructions (Addendum)
Moderate persistent asthma - Lung testing not done today. - We are not going to make any changes at all.  - Add on the Flovent during periods of flares/increased respiratory infections for 1-2 weeks.  - May use albuterol 2 puffs every 4 hours as needed for cough, wheeze, tightness in chest, shortness of breath. Asthma control goals:  Full participation in all desired activities (may need albuterol before activity) Albuterol use two time or less a week on average (not counting use with activity) Cough interfering with sleep two time or less a month Oral steroids no more than once a year No hospitalizations  2. Seasonal and perennial allergic rhinitis - Continue with the as needed use of an antihistamine. - Restart the Singulair 10 mg as above - Continue with the Pazeo eye drops as needed.  - We could restart the nose sprays if things get really bad, but we are not going to worry about it. - We will restart your allergy shots at the Yellow Vial 0.1 mL and advance from there.   3. Return in about 6 months (around 11/12/2021).    Please inform us of any Emergency Department visits, hospitalizations, or changes in symptoms. Call us before going to the ED for breathing or allergy symptoms since we might be able to fit you in for a sick visit. Feel free to contact us anytime with any questions, problems, or concerns.  It was a pleasure to see you again today!  Websites that have reliable patient information: 1. American Academy of Asthma, Allergy, and Immunology: www.aaaai.org 2. Food Allergy Research and Education (FARE): foodallergy.org 3. Mothers of Asthmatics: http://www.asthmacommunitynetwork.org 4. American College of Allergy, Asthma, and Immunology: www.acaai.org   COVID-19 Vaccine Information can be found at: [email or web address] For questions related to vaccine distribution or appointments, please email [email or web address]  or call [phone].   We realize that you might be concerned about having an allergic reaction to the COVID19 vaccines. To help with that concern, WE ARE OFFERING THE COVID19 VACCINES IN OUR OFFICE! Ask the front desk for dates!     "Like" [place] on Facebook and Instagram for our latest updates!      A healthy democracy works best when [employer] participate! Make sure you are registered to vote! If you have moved or changed any of your contact information, you will need to get this updated before voting!  In some cases, you MAY be able to register to vote online: [email or web address]

## 2021-11-12 ENCOUNTER — Encounter: Payer: Self-pay | Admitting: Allergy & Immunology

## 2021-11-12 ENCOUNTER — Ambulatory Visit (INDEPENDENT_AMBULATORY_CARE_PROVIDER_SITE_OTHER): Payer: 59 | Admitting: Allergy & Immunology

## 2021-11-12 ENCOUNTER — Other Ambulatory Visit: Payer: Self-pay

## 2021-11-12 VITALS — BP 130/80 | HR 88 | Temp 98.7°F | Resp 17 | Ht 69.5 in | Wt 288.8 lb

## 2021-11-12 DIAGNOSIS — J3089 Other allergic rhinitis: Secondary | ICD-10-CM | POA: Diagnosis not present

## 2021-11-12 DIAGNOSIS — J4540 Moderate persistent asthma, uncomplicated: Secondary | ICD-10-CM

## 2021-11-12 DIAGNOSIS — J302 Other seasonal allergic rhinitis: Secondary | ICD-10-CM

## 2021-11-12 NOTE — Progress Notes (Signed)
? ?FOLLOW UP ? ?Date of Service/Encounter:  11/12/21 ? ? ?Assessment:  ? ?Moderate persistent asthma without complication - with continued restrictive spirometry (has been normal in the past) ?  ?Seasonal and perennial allergic rhinitis - wanting to restart allergen immunotherapy ? ?Allergic reaction - starting prednisone burst today ? ?Plan/Recommendations:  ? ?Moderate persistent asthma ?- Lung testing looked terrible today. ?- We are starting you on a prednisone dose pack today. ?- We are going to try Trelegy one puff once daily. ?- There is a copay card and this contains three medications to help control asthma.  ?- Two samples provided today (call us in a week to let us know how you feel).  ?- Daily controller medication(s): Trelegy 100/62.5/25 one puff once daily ?- Prior to physical activity: albuterol 2 puffs 10-15 minutes before physical activity. ?- Rescue medications: albuterol 4 puffs every 4-6 hours as needed ?- Asthma control goals:  ?* Full participation in all desired activities (may need albuterol before activity) ?* Albuterol use two time or less a week on average (not counting use with activity) ?* Cough interfering with sleep two time or less a month ?* Oral steroids no more than once a year ?* No hospitalizations ? ?2. Seasonal and perennial allergic rhinitis - with marked allergic reaction ?- Continue with cetirizine 10mg  1-2 times daily every day (especially during the growing season).  ?- Continue with the Singulair 10 mg once daily.  ?- Continue with the Pazeo eye drops as needed.  ?- We will restart your allergy shots at the BLUE VIAL 0.05 mL and advance from there.  ? ?3. Return in about 6 weeks (around 12/24/2021).  ? ? ?Subjective:  ? ?Frank Gilbert is a 36 y.o. male presenting today for follow up of  ?Chief Complaint  ?Patient presents with  ? Allergic Rhinitis   ? Asthma  ? ? ?28 has a history of the following: ?Patient Active Problem List  ? Diagnosis Date Noted   ? Snoring 05/14/2021  ? Seasonal and perennial allergic rhinitis 11/21/2018  ? Heartburn 11/21/2018  ? Moderate persistent asthma without complication 11/21/2018  ? Allergic conjunctivitis of both eyes 11/21/2018  ? ? ?History obtained from: chart review and patient. ? ?Frank Gilbert is a 36 y.o. male presenting for a follow up visit.  He was last seen in October 2022.  At that time, we did not do lung testing.  We added on Flovent to use during periods of flares.  We continue with albuterol as needed.  For the rhinitis, we continued with the as needed use of an antihistamine as well as Singulair and Pazeo.  He was interested in restarting allergen immunotherapy.  We started him at Yellow 0.1 mL.  Unfortunately, it does not seem that he came back for additional doses. ? ?Asthma/Respiratory Symptom History: He is not taking any medications right now.  He is SOB "here and there", but not daily. He is not coughing at night at all.  He does not want to use anything on a daily basis.  He has not been on prednisone and has not been to the emergency room.  He denies cough at all, but he does wheeze during the day especially during the growing season.  This is definitely a big trigger for him.  I reviewed his spirometry's and they have been completely normal in the past, although the last time we did it in May 2022, his FEV1 was 58%. ? ?Allergic Rhinitis Symptom History: He  is experiencing a right eye swelling right now.  He was working in the garden and he woke up with his eye the way it is today. He double cetirizine and it did not help. It tends to happen around this time every year. It is nearly daily during this time of the year. He does not have eye drops. He does take one cetirizine nightly.  He does remain interested in allergen immunotherapy.  He thinks he will be more compliant this time around. ? ?His daughter has been having issues of uncontrolled rheumatoid arthritis. She is on MTX and Humira and now they are  going to change her to monthly infusions.  They are hoping that she can stop her methotrexate if this next infusion works better. ? ?He has resumed his gardening after the winter.  He has 42 tomatoes as well as watermelon, cantaloupe, strawberries, and peppers.  He also has several fruit trees.  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 ? ?Otherwise, there have been no changes to his past medical history, surgical history, family history, or social history. ? ? ? ?Review of Systems  ?Constitutional: Negative.  Negative for chills, fever, malaise/fatigue and weight loss.  ?HENT:  Positive for congestion. Negative for ear discharge, ear pain and sinus pain.   ?     Positive for sneezing. Positive for snoring.  ?Eyes:  Negative for pain, discharge and redness.  ?Respiratory:  Negative for cough, sputum production, shortness of breath and wheezing.   ?Cardiovascular: Negative.  Negative for chest pain and palpitations.  ?Gastrointestinal:  Negative for abdominal pain, constipation, diarrhea, heartburn, nausea and vomiting.  ?Skin: Negative.  Negative for itching and rash.  ?Neurological:  Negative for dizziness and headaches.  ?Endo/Heme/Allergies:  Positive for environmental allergies. Does not bruise/bleed easily.   ? ? ? ?Objective:  ? ?Blood pressure 130/80, pulse 88, temperature 98.7 ?F (37.1 ?C), temperature source Temporal, resp. rate 17, height 5' 9.5" (1.765 m), weight 288 lb 12.8 oz (131 kg), SpO2 96 %. ?Body mass index is 42.04 kg/m?. ? ? ? ?Physical Exam ?Vitals reviewed.  ?Constitutional:   ?   Appearance: He is well-developed.  ?HENT:  ?   Head: Normocephalic and atraumatic.  ?   Right Ear: Tympanic membrane, ear canal and external ear normal.  ?   Left Ear: Tympanic membrane, ear canal and external ear normal.  ?   Nose: No nasal deformity, septal deviation, mucosal edema or rhinorrhea.  ?   Right Turbinates: Enlarged, swollen and pale.  ?   Left Turbinates:  Enlarged, swollen and pale.  ?   Right Sinus: No maxillary sinus tenderness or frontal sinus tenderness.  ?   Left Sinus: No maxillary sinus tenderness or frontal sinus tenderness.  ?   Mouth/Throat:  ?   Mouth: Mucous membranes are not pale and not dry.  ?   Pharynx: Uvula midline.  ?Eyes:  ?   General: Lids are normal. Allergic shiner present.     ?   Right eye: No discharge.     ?   Left eye: No discharge.  ?   Conjunctiva/sclera:  ?   Right eye: Right conjunctiva is injected. Chemosis present.  ?   Left eye: Left conjunctiva is not injected. No chemosis. ?   Pupils: Pupils are equal, round, and reactive to light.  ?   Comments: Right periorbital edema.   ?Cardiovascular:  ?   Rate  and Rhythm: Normal rate and regular rhythm.  ?   Heart sounds: Normal heart sounds.  ?Pulmonary:  ?   Effort: Pulmonary effort is normal. No tachypnea, accessory muscle usage or respiratory distress.  ?   Breath sounds: Normal breath sounds. No wheezing, rhonchi or rales.  ?   Comments: Moving air well in all lung fields. ?Chest:  ?   Chest wall: No tenderness.  ?Lymphadenopathy:  ?   Cervical: No cervical adenopathy.  ?Skin: ?   Coloration: Skin is not pale.  ?   Findings: No abrasion, erythema, petechiae or rash. Rash is not papular, urticarial or vesicular.  ?Neurological:  ?   Mental Status: He is alert.  ?Psychiatric:     ?   Behavior: Behavior is cooperative.  ?  ? ?Diagnostic studies:   ? ?Spirometry: results abnormal (FEV1: 2.60/61%, FVC: 3.07/58%, FEV1/FVC: 85%).  ?  ?Spirometry consistent with possible restrictive disease.  ? ? ?Allergy Studies: none ? ? ? ? ? ?  ?Malachi Bonds, MD  ?[facility] ? ? ? ? ? ? ?

## 2021-11-12 NOTE — Patient Instructions (Addendum)
Moderate persistent asthma ?- Lung testing looked terrible today. ?- We are starting you on a prednisone dose pack today. ?- We are going to try Trelegy one puff once daily. ?- There is a copay card and this contains three medications to help control asthma.  ?- Two samples provided today (call us in a week to let us know how you feel).  ?- Daily controller medication(s): Trelegy 100/62.5/25 one puff once daily ?- Prior to physical activity: albuterol 2 puffs 10-15 minutes before physical activity. ?- Rescue medications: albuterol 4 puffs every 4-6 hours as needed ?- Asthma control goals:  ?* Full participation in all desired activities (may need albuterol before activity) ?* Albuterol use two time or less a week on average (not counting use with activity) ?* Cough interfering with sleep two time or less a month ?* Oral steroids no more than once a year ?* No hospitalizations ? ?2. Seasonal and perennial allergic rhinitis ?- Continue with cetirizine 10mg  1-2 times daily every day (especially during the growing season).  ?- Continue with the Singulair 10 mg once daily.  ?- Continue with the Pazeo eye drops as needed.  ?- We will restart your allergy shots at the BLUE VIAL 0.05 mL and advance from there.  ? ?3. Return in about 6 weeks (around 12/24/2021).  ? ? ?Please inform us of any Emergency Department visits, hospitalizations, or changes in symptoms. Call us before going to the ED for breathing or allergy symptoms since we might be able to fit you in for a sick visit. Feel free to contact us anytime with any questions, problems, or concerns. ? ?It was a pleasure to see you again today! ? ?Websites that have reliable patient information: ?1. American Academy of Asthma, Allergy, and Immunology: www.aaaai.org ?2. Food Allergy Research and Education (FARE): foodallergy.org ?3. Mothers of Asthmatics: http://www.asthmacommunitynetwork.org ?4. [employer] of Allergy, Asthma, and Immunology:  MonthlyElectricBill.co.uk ? ? ?COVID-19 Vaccine Information can be found at: ShippingScam.co.uk For questions related to vaccine distribution or appointments, please email [email or web address] or call [phone].  ? ?We realize that you might be concerned about having an allergic reaction to the COVID19 vaccines. To help with that concern, WE ARE OFFERING THE COVID19 VACCINES IN OUR OFFICE! Ask the front desk for dates!  ? ? ? ??Like? [place] on Facebook and Instagram for our latest updates!  ?  ? ? ?A healthy democracy works best when [employer] participate! Make sure you are registered to vote! If you have moved or changed any of your contact information, you will need to get this updated before voting! ? ?In some cases, you MAY be able to register to vote online: [email or web address] ? ? ? ? ? ? ? ?

## 2022-01-02 ENCOUNTER — Ambulatory Visit: Payer: 59 | Admitting: Allergy & Immunology

## 2022-01-02 DIAGNOSIS — J309 Allergic rhinitis, unspecified: Secondary | ICD-10-CM

## 2022-12-29 ENCOUNTER — Ambulatory Visit
Admission: EM | Admit: 2022-12-29 | Discharge: 2022-12-29 | Disposition: A | Discharge status: Home / Self Care | Payer: 59 | Attending: Nurse Practitioner | Admitting: Nurse Practitioner

## 2022-12-29 DIAGNOSIS — Z1152 Encounter for screening for COVID-19: Secondary | ICD-10-CM

## 2022-12-29 DIAGNOSIS — W57XXXA Bitten or stung by nonvenomous insect and other nonvenomous arthropods, initial encounter: Secondary | ICD-10-CM

## 2022-12-29 DIAGNOSIS — S70361A Insect bite (nonvenomous), right thigh, initial encounter: Secondary | ICD-10-CM | POA: Diagnosis present

## 2022-12-29 DIAGNOSIS — R051 Acute cough: Secondary | ICD-10-CM

## 2022-12-29 MED ORDER — DOXYCYCLINE HYCLATE 100 MG PO CAPS: 100.0000 mg | ORAL_CAPSULE | Freq: Two times a day (BID) | ORAL | 0 refills | Status: AC

## 2022-12-29 NOTE — Discharge Instructions (Signed)
We are testing you today for tick borne illness.  Please start the doxycycline 100 mg twice daily for 7 days to treat for tick borne illness since you have a red rash and are having symptoms.  We will call you later this week or early next week if the results are positive.   We have also tested you today for COVID-19.  You will see the results in Mychart and we will call you with positive results.    Some things that can make you feel better are: - Increased rest - Increasing fluid with water/sugar free electrolytes - Acetaminophen and ibuprofen as needed for fever/pain - OTC guaifenesin (Mucinex) 600 mg twice daily for congestion - Saline sinus flushes or a neti pot - Humidifying the air

## 2022-12-29 NOTE — ED Provider Notes (Signed)
[facility]    CSN: [id number] Arrival date & time: 12/29/22  1632      History   Chief Complaint Chief Complaint  Patient presents with   Weakness    HPI Frank Gilbert is a 37 y.o. male.   Patient presents today with wife for tick bite to right thigh that they noticed 4 days ago.  Tick was removed promptly the same day it was noticed.  W[place] and have had more than 30 tick bites in the family this season so far.  Reports Saturday evening, he started developing muscle pain and joint pain and fever.  Also endorses, chills, headache, slight dry/hacky cough, stuffy nose, decreased appetite, and fatigue.  He reports a rash began developing around the tick bite 1 day ago and it has been enlarging in size.  It is red, itchy, and tender to touch.  No sore throat, runny nose, vomiting, or diarrhea.   Ibuprofen seems to help with fever symptoms.    Past Medical History:  Diagnosis Date   Asthma    Headache    migraines   Kidney stone    right ureteral stone   Sleep apnea    no longer uses CPAP    Patient Active Problem List   Diagnosis Date Noted   Snoring 05/14/2021   Seasonal and perennial allergic rhinitis 11/21/2018   Heartburn 11/21/2018   Moderate persistent asthma without complication 11/21/2018   Allergic conjunctivitis of both eyes 11/21/2018    Past Surgical History:  Procedure Laterality Date   LITHOTRIPSY  06/2014       Home Medications    Prior to Admission medications   Medication Sig Start Date End Date Taking? Authorizing Provider  doxycycline (VIBRAMYCIN) 100 MG capsule Take 1 capsule (100 mg total) by mouth 2 (two) times daily for 7 days. 12/29/22 01/05/23 Yes Valentino Nose, NP  albuterol (PROAIR HFA) 108 (90 Base) MCG/ACT inhaler Inhale 2 puffs into the lungs every 4 (four) hours as needed. 01/13/21   Nehemiah Settle, FNP  azelastine (ASTELIN) 0.1 % nasal spray 1-2 sprays in each nostril once a day as  needed. Patient not taking: Reported on 11/12/2021 01/13/21   Nehemiah Settle, FNP  Azelastine-Fluticasone [facility]) [phone] MCG/ACT SUSP Use 1 spray in each nostril twice a day as needed for runny/stuffy nose Patient not taking: Reported on 11/12/2021 12/11/20   Nehemiah Settle, FNP  beclomethasone (QVAR REDIHALER) 40 MCG/ACT inhaler Inhale 2 puffs into the lungs 2 (two) times daily. Patient not taking: Reported on 11/12/2021 01/13/21   Nehemiah Settle, FNP  cetirizine (ZYRTEC) 10 MG tablet cetirizine 10 mg tablet 01/13/21   Nehemiah Settle, FNP  EPINEPHrine 0.3 mg/0.3 mL IJ SOAJ injection TAKE AS DIRECTED PER PACKAGE INSTRUCTIONS. 09/25/20   Nehemiah Settle, FNP  FLUoxetine (PROZAC) 20 MG capsule Take 20 mg by mouth daily. 05/08/21   [provider]  fluticasone (FLONASE) 50 MCG/ACT nasal spray 1-2 sprays in each nostril once daily as needed. Patient not taking: Reported on 11/12/2021 01/13/21   Nehemiah Settle, FNP  fluticasone (FLOVENT HFA) 44 MCG/ACT inhaler 2 puffs by mouth 2 times daily with spacer until symptoms have resolved. Patient not taking: Reported on 11/12/2021 01/13/21   Nehemiah Settle, FNP  montelukast (SINGULAIR) 10 MG tablet Take 1 tablet (10 mg total) by mouth at bedtime. Patient not taking: Reported on 11/12/2021 01/13/21   Nehemiah Settle, FNP  olopatadine (PATANOL) 0.1 % ophthalmic solution Place 1 drop into  both eyes 2 (two) times daily as needed for allergies. Patient not taking: Reported on 11/12/2021 01/13/21   Nehemiah Settle, FNP    Family History Family History  Problem Relation Age of Onset   Migraines Mother    Healthy Father    Healthy Sister    Healthy Sister    Healthy Child    Allergic rhinitis Neg Hx    Angioedema Neg Hx    Asthma Neg Hx    Atopy Neg Hx    Immunodeficiency Neg Hx    Eczema Neg Hx    Urticaria Neg Hx     Social History Social History   Tobacco Use   Smoking status: Former    Types: Cigarettes    Quit date: 04/03/2008    Years  since quitting: 14.7   Smokeless tobacco: Former    Quit date: 04/03/2005  Substance Use Topics   Alcohol use: Yes    Alcohol/week: 0.0 standard drinks of alcohol    Comment: Rare   Drug use: No     Allergies   Oxycodone and Penicillins   Review of Systems Review of Systems Per HPI  Physical Exam Triage Vital Signs ED Triage Vitals  Enc Vitals Group     BP 12/29/22 1637 120/79     Pulse Rate 12/29/22 1637 86     Resp 12/29/22 1637 18     Temp 12/29/22 1637 98.2 F (36.8 C)     Temp Source 12/29/22 1637 Oral     SpO2 --      Weight --      Height --      Head Circumference --      Peak Flow --      Pain Score 12/29/22 1639 0     Pain Loc --      Pain Edu? --      Excl. in GC? --    No data found.  Updated Vital Signs BP 120/79 (BP Location: Right Arm)   Pulse 86   Temp 98.2 F (36.8 C) (Oral)   Resp 18   SpO2 96%   Visual Acuity Right Eye Distance:   Left Eye Distance:   Bilateral Distance:    Right Eye Near:   Left Eye Near:    Bilateral Near:     Physical Exam Vitals and nursing note reviewed.  Constitutional:      General: He is not in acute distress.    Appearance: Normal appearance.  HENT:     Head: Normocephalic and atraumatic.     Nose: Nose normal. No congestion or rhinorrhea.     Mouth/Throat:     Mouth: Mucous membranes are moist.     Pharynx: Oropharynx is clear. No oropharyngeal exudate or posterior oropharyngeal erythema.  Eyes:     General:        Right eye: No discharge.        Left eye: No discharge.     Extraocular Movements: Extraocular movements intact.  Cardiovascular:     Rate and Rhythm: Normal rate and regular rhythm.  Pulmonary:     Effort: Pulmonary effort is normal. No respiratory distress.     Breath sounds: Normal breath sounds. No stridor. No wheezing, rhonchi or rales.  Musculoskeletal:     Cervical back: Normal range of motion.  Lymphadenopathy:     Cervical: No cervical adenopathy.  Skin:    General: Skin  is warm and dry.     Capillary Refill: Capillary refill takes less  than 2 seconds.     Coloration: Skin is pale. Skin is not jaundiced.     Findings: Erythema present. No bruising.     Comments: Erythematous rash to right groin; see photograph below.  They appears to be an area of central clearing.   Neurological:     Mental Status: He is oriented to person, place, and time. He is lethargic.  Psychiatric:        Behavior: Behavior is cooperative.      UC Treatments / Results  Labs (all labs ordered are listed, but only abnormal results are displayed) Labs Reviewed  SARS CORONAVIRUS 2 (TAT 6-24 HRS)  SPOTTED FEVER GROUP ANTIBODIES  LYME DISEASE SEROLOGY W/REFLEX    EKG   Radiology No results found.  Procedures Procedures (including critical care time)  Medications Ordered in UC Medications - No data to display  Initial Impression / Assessment and Plan / UC Course  I have reviewed the triage vital signs and the nursing notes.  Pertinent labs & imaging results that were available during my care of the patient were reviewed by me and considered in my medical decision making (see chart for details).   Patient is well-appearing, normotensive, afebrile, not tachycardic, not tachypneic, oxygenating well on room air.   1. Acute cough 2. Encounter for screening for COVID-19 COVID-19 is pending Vitals and exam reassuring  Supportive care discussed with patient and wife   3. Tick bite of right thigh, initial encounter Rash is concerned for erythema migrans Lyme and RMSF titers are pending Will treat with doxycycline given rash and symptoms Other supportive care discussed ER and return precautions also discussed  The patient was given the opportunity to ask questions.  All questions answered to their satisfaction.  The patient is in agreement to this plan.   Final Clinical Impressions(s) / UC Diagnoses   Final diagnoses:  Acute cough  Tick bite of right thigh, initial  encounter  Encounter for screening for COVID-19     Discharge Instructions      We are testing you today for tick borne illness.  Please start the doxycycline 100 mg twice daily for 7 days to treat for tick borne illness since you have a red rash and are having symptoms.  We will call you later this week or early next week if the results are positive.   We have also tested you today for COVID-19.  You will see the results in Mychart and we will call you with positive results.    Some things that can make you feel better are: - Increased rest - Increasing fluid with water/sugar free electrolytes - Acetaminophen and ibuprofen as needed for fever/pain - OTC guaifenesin (Mucinex) 600 mg twice daily for congestion - Saline sinus flushes or a neti pot - Humidifying the air     ED Prescriptions     Medication Sig Dispense Auth. Provider   doxycycline (VIBRAMYCIN) 100 MG capsule Take 1 capsule (100 mg total) by mouth 2 (two) times daily for 7 days. 14 capsule Valentino Nose, NP      PDMP not reviewed this encounter.   Valentino Nose, NP 12/29/22 1737

## 2022-12-29 NOTE — ED Triage Notes (Signed)
Pt reports he got bit by a tick on his right inner thigh and under his stomach x 4 days and  believes it is making him sick. He is having cold chills, weakness, muscle cramps, dizziness, loss of appetite, lethargic, lower right leg pain.

## 2022-12-30 LAB — SARS CORONAVIRUS 2 (TAT 6-24 HRS): SARS Coronavirus 2: NEGATIVE

## 2023-01-04 LAB — SPOTTED FEVER GROUP ANTIBODIES
Spotted Fever Group IgG: <1:64 {titer}
Spotted Fever Group IgM: <1:64 {titer}

## 2023-01-04 LAB — LYME DISEASE SEROLOGY W/REFLEX: Lyme Total Antibody EIA: NEGATIVE

## 2024-07-13 ENCOUNTER — Ambulatory Visit: Admitting: Physician Assistant
